# Patient Record
Sex: Male | Born: 1955 | Race: Asian | Hispanic: No | Marital: Married | Smoking: Never smoker
Health system: Southern US, Community
[De-identification: ages and names within clinical notes are randomized; demographics above are authoritative.]

## PROBLEM LIST (undated history)

## (undated) DIAGNOSIS — I10 Essential (primary) hypertension: Secondary | ICD-10-CM

## (undated) DIAGNOSIS — R7303 Prediabetes: Secondary | ICD-10-CM

## (undated) DIAGNOSIS — K219 Gastro-esophageal reflux disease without esophagitis: Secondary | ICD-10-CM

## (undated) DIAGNOSIS — E785 Hyperlipidemia, unspecified: Secondary | ICD-10-CM

## (undated) DIAGNOSIS — N400 Enlarged prostate without lower urinary tract symptoms: Secondary | ICD-10-CM

## (undated) HISTORY — PX: HIATAL HERNIA REPAIR: SHX195

---

## 2019-10-03 DIAGNOSIS — R0982 Postnasal drip: Secondary | ICD-10-CM | POA: Diagnosis not present

## 2019-10-03 DIAGNOSIS — J342 Deviated nasal septum: Secondary | ICD-10-CM | POA: Diagnosis not present

## 2019-10-03 DIAGNOSIS — E119 Type 2 diabetes mellitus without complications: Secondary | ICD-10-CM | POA: Diagnosis not present

## 2019-10-03 DIAGNOSIS — J301 Allergic rhinitis due to pollen: Secondary | ICD-10-CM | POA: Diagnosis not present

## 2019-10-11 DIAGNOSIS — J301 Allergic rhinitis due to pollen: Secondary | ICD-10-CM | POA: Diagnosis not present

## 2019-10-11 DIAGNOSIS — R059 Cough, unspecified: Secondary | ICD-10-CM | POA: Diagnosis not present

## 2019-10-11 DIAGNOSIS — R0982 Postnasal drip: Secondary | ICD-10-CM | POA: Diagnosis not present

## 2019-10-30 DIAGNOSIS — H04123 Dry eye syndrome of bilateral lacrimal glands: Secondary | ICD-10-CM | POA: Diagnosis not present

## 2019-10-30 DIAGNOSIS — H35713 Central serous chorioretinopathy, bilateral: Secondary | ICD-10-CM | POA: Diagnosis not present

## 2019-10-30 DIAGNOSIS — H43823 Vitreomacular adhesion, bilateral: Secondary | ICD-10-CM | POA: Diagnosis not present

## 2019-10-31 DIAGNOSIS — Z0000 Encounter for general adult medical examination without abnormal findings: Secondary | ICD-10-CM | POA: Diagnosis not present

## 2019-10-31 DIAGNOSIS — Z79899 Other long term (current) drug therapy: Secondary | ICD-10-CM | POA: Diagnosis not present

## 2019-10-31 DIAGNOSIS — E119 Type 2 diabetes mellitus without complications: Secondary | ICD-10-CM | POA: Diagnosis not present

## 2019-11-06 DIAGNOSIS — Z0000 Encounter for general adult medical examination without abnormal findings: Secondary | ICD-10-CM | POA: Diagnosis not present

## 2019-11-06 DIAGNOSIS — E119 Type 2 diabetes mellitus without complications: Secondary | ICD-10-CM | POA: Diagnosis not present

## 2019-11-06 DIAGNOSIS — N401 Enlarged prostate with lower urinary tract symptoms: Secondary | ICD-10-CM | POA: Diagnosis not present

## 2019-11-06 DIAGNOSIS — Z79899 Other long term (current) drug therapy: Secondary | ICD-10-CM | POA: Diagnosis not present

## 2019-11-06 DIAGNOSIS — R0982 Postnasal drip: Secondary | ICD-10-CM | POA: Diagnosis not present

## 2019-12-27 DIAGNOSIS — M5451 Vertebrogenic low back pain: Secondary | ICD-10-CM | POA: Diagnosis not present

## 2019-12-27 DIAGNOSIS — M9902 Segmental and somatic dysfunction of thoracic region: Secondary | ICD-10-CM | POA: Diagnosis not present

## 2019-12-27 DIAGNOSIS — M791 Myalgia, unspecified site: Secondary | ICD-10-CM | POA: Diagnosis not present

## 2019-12-27 DIAGNOSIS — M9903 Segmental and somatic dysfunction of lumbar region: Secondary | ICD-10-CM | POA: Diagnosis not present

## 2020-01-10 DIAGNOSIS — M5451 Vertebrogenic low back pain: Secondary | ICD-10-CM | POA: Diagnosis not present

## 2020-01-10 DIAGNOSIS — M9902 Segmental and somatic dysfunction of thoracic region: Secondary | ICD-10-CM | POA: Diagnosis not present

## 2020-01-10 DIAGNOSIS — M9903 Segmental and somatic dysfunction of lumbar region: Secondary | ICD-10-CM | POA: Diagnosis not present

## 2020-01-10 DIAGNOSIS — M791 Myalgia, unspecified site: Secondary | ICD-10-CM | POA: Diagnosis not present

## 2020-01-15 DIAGNOSIS — M791 Myalgia, unspecified site: Secondary | ICD-10-CM | POA: Diagnosis not present

## 2020-01-15 DIAGNOSIS — M9903 Segmental and somatic dysfunction of lumbar region: Secondary | ICD-10-CM | POA: Diagnosis not present

## 2020-01-15 DIAGNOSIS — M9902 Segmental and somatic dysfunction of thoracic region: Secondary | ICD-10-CM | POA: Diagnosis not present

## 2020-01-15 DIAGNOSIS — M5451 Vertebrogenic low back pain: Secondary | ICD-10-CM | POA: Diagnosis not present

## 2020-01-17 DIAGNOSIS — M791 Myalgia, unspecified site: Secondary | ICD-10-CM | POA: Diagnosis not present

## 2020-01-17 DIAGNOSIS — M9903 Segmental and somatic dysfunction of lumbar region: Secondary | ICD-10-CM | POA: Diagnosis not present

## 2020-01-17 DIAGNOSIS — M5451 Vertebrogenic low back pain: Secondary | ICD-10-CM | POA: Diagnosis not present

## 2020-01-17 DIAGNOSIS — M9902 Segmental and somatic dysfunction of thoracic region: Secondary | ICD-10-CM | POA: Diagnosis not present

## 2020-01-19 DIAGNOSIS — M5451 Vertebrogenic low back pain: Secondary | ICD-10-CM | POA: Diagnosis not present

## 2020-01-19 DIAGNOSIS — M791 Myalgia, unspecified site: Secondary | ICD-10-CM | POA: Diagnosis not present

## 2020-01-19 DIAGNOSIS — M9903 Segmental and somatic dysfunction of lumbar region: Secondary | ICD-10-CM | POA: Diagnosis not present

## 2020-01-19 DIAGNOSIS — M9902 Segmental and somatic dysfunction of thoracic region: Secondary | ICD-10-CM | POA: Diagnosis not present

## 2020-01-22 DIAGNOSIS — M5451 Vertebrogenic low back pain: Secondary | ICD-10-CM | POA: Diagnosis not present

## 2020-01-22 DIAGNOSIS — M9903 Segmental and somatic dysfunction of lumbar region: Secondary | ICD-10-CM | POA: Diagnosis not present

## 2020-01-22 DIAGNOSIS — M791 Myalgia, unspecified site: Secondary | ICD-10-CM | POA: Diagnosis not present

## 2020-01-22 DIAGNOSIS — M9902 Segmental and somatic dysfunction of thoracic region: Secondary | ICD-10-CM | POA: Diagnosis not present

## 2020-01-23 DIAGNOSIS — M791 Myalgia, unspecified site: Secondary | ICD-10-CM | POA: Diagnosis not present

## 2020-01-23 DIAGNOSIS — M5451 Vertebrogenic low back pain: Secondary | ICD-10-CM | POA: Diagnosis not present

## 2020-01-23 DIAGNOSIS — M9903 Segmental and somatic dysfunction of lumbar region: Secondary | ICD-10-CM | POA: Diagnosis not present

## 2020-01-23 DIAGNOSIS — M9902 Segmental and somatic dysfunction of thoracic region: Secondary | ICD-10-CM | POA: Diagnosis not present

## 2020-01-25 DIAGNOSIS — M9902 Segmental and somatic dysfunction of thoracic region: Secondary | ICD-10-CM | POA: Diagnosis not present

## 2020-01-25 DIAGNOSIS — M5451 Vertebrogenic low back pain: Secondary | ICD-10-CM | POA: Diagnosis not present

## 2020-01-25 DIAGNOSIS — M791 Myalgia, unspecified site: Secondary | ICD-10-CM | POA: Diagnosis not present

## 2020-01-25 DIAGNOSIS — M9903 Segmental and somatic dysfunction of lumbar region: Secondary | ICD-10-CM | POA: Diagnosis not present

## 2020-01-30 DIAGNOSIS — M9903 Segmental and somatic dysfunction of lumbar region: Secondary | ICD-10-CM | POA: Diagnosis not present

## 2020-01-30 DIAGNOSIS — Z79899 Other long term (current) drug therapy: Secondary | ICD-10-CM | POA: Diagnosis not present

## 2020-01-30 DIAGNOSIS — M5451 Vertebrogenic low back pain: Secondary | ICD-10-CM | POA: Diagnosis not present

## 2020-01-30 DIAGNOSIS — M9902 Segmental and somatic dysfunction of thoracic region: Secondary | ICD-10-CM | POA: Diagnosis not present

## 2020-01-30 DIAGNOSIS — M791 Myalgia, unspecified site: Secondary | ICD-10-CM | POA: Diagnosis not present

## 2020-01-30 DIAGNOSIS — E119 Type 2 diabetes mellitus without complications: Secondary | ICD-10-CM | POA: Diagnosis not present

## 2020-02-05 DIAGNOSIS — M791 Myalgia, unspecified site: Secondary | ICD-10-CM | POA: Diagnosis not present

## 2020-02-05 DIAGNOSIS — M9903 Segmental and somatic dysfunction of lumbar region: Secondary | ICD-10-CM | POA: Diagnosis not present

## 2020-02-05 DIAGNOSIS — M5451 Vertebrogenic low back pain: Secondary | ICD-10-CM | POA: Diagnosis not present

## 2020-02-05 DIAGNOSIS — M9902 Segmental and somatic dysfunction of thoracic region: Secondary | ICD-10-CM | POA: Diagnosis not present

## 2020-02-07 DIAGNOSIS — M791 Myalgia, unspecified site: Secondary | ICD-10-CM | POA: Diagnosis not present

## 2020-02-07 DIAGNOSIS — M5451 Vertebrogenic low back pain: Secondary | ICD-10-CM | POA: Diagnosis not present

## 2020-02-07 DIAGNOSIS — M9903 Segmental and somatic dysfunction of lumbar region: Secondary | ICD-10-CM | POA: Diagnosis not present

## 2020-02-07 DIAGNOSIS — M9902 Segmental and somatic dysfunction of thoracic region: Secondary | ICD-10-CM | POA: Diagnosis not present

## 2020-02-12 DIAGNOSIS — E119 Type 2 diabetes mellitus without complications: Secondary | ICD-10-CM | POA: Diagnosis not present

## 2020-02-12 DIAGNOSIS — G4733 Obstructive sleep apnea (adult) (pediatric): Secondary | ICD-10-CM | POA: Diagnosis not present

## 2020-02-12 DIAGNOSIS — N401 Enlarged prostate with lower urinary tract symptoms: Secondary | ICD-10-CM | POA: Diagnosis not present

## 2020-02-12 DIAGNOSIS — R519 Headache, unspecified: Secondary | ICD-10-CM | POA: Diagnosis not present

## 2020-02-28 DIAGNOSIS — G4733 Obstructive sleep apnea (adult) (pediatric): Secondary | ICD-10-CM | POA: Diagnosis not present

## 2020-02-28 DIAGNOSIS — R0683 Snoring: Secondary | ICD-10-CM | POA: Diagnosis not present

## 2020-02-29 DIAGNOSIS — R945 Abnormal results of liver function studies: Secondary | ICD-10-CM | POA: Diagnosis not present

## 2020-02-29 DIAGNOSIS — A084 Viral intestinal infection, unspecified: Secondary | ICD-10-CM | POA: Diagnosis not present

## 2020-02-29 DIAGNOSIS — K9289 Other specified diseases of the digestive system: Secondary | ICD-10-CM | POA: Diagnosis not present

## 2020-05-08 DIAGNOSIS — E119 Type 2 diabetes mellitus without complications: Secondary | ICD-10-CM | POA: Diagnosis not present

## 2020-05-08 DIAGNOSIS — Z6825 Body mass index (BMI) 25.0-25.9, adult: Secondary | ICD-10-CM | POA: Diagnosis not present

## 2020-05-08 DIAGNOSIS — N401 Enlarged prostate with lower urinary tract symptoms: Secondary | ICD-10-CM | POA: Diagnosis not present

## 2020-05-08 DIAGNOSIS — I10 Essential (primary) hypertension: Secondary | ICD-10-CM | POA: Diagnosis not present

## 2020-05-09 DIAGNOSIS — E119 Type 2 diabetes mellitus without complications: Secondary | ICD-10-CM | POA: Diagnosis not present

## 2020-05-09 DIAGNOSIS — K7581 Nonalcoholic steatohepatitis (NASH): Secondary | ICD-10-CM | POA: Diagnosis not present

## 2020-05-09 DIAGNOSIS — Z79899 Other long term (current) drug therapy: Secondary | ICD-10-CM | POA: Diagnosis not present

## 2020-05-09 DIAGNOSIS — E7800 Pure hypercholesterolemia, unspecified: Secondary | ICD-10-CM | POA: Diagnosis not present

## 2020-06-05 DIAGNOSIS — R7989 Other specified abnormal findings of blood chemistry: Secondary | ICD-10-CM | POA: Diagnosis not present

## 2020-06-05 DIAGNOSIS — K760 Fatty (change of) liver, not elsewhere classified: Secondary | ICD-10-CM | POA: Diagnosis not present

## 2020-06-05 DIAGNOSIS — K219 Gastro-esophageal reflux disease without esophagitis: Secondary | ICD-10-CM | POA: Diagnosis not present

## 2020-06-11 DIAGNOSIS — Z86010 Personal history of colonic polyps: Secondary | ICD-10-CM | POA: Diagnosis not present

## 2020-06-11 DIAGNOSIS — K635 Polyp of colon: Secondary | ICD-10-CM | POA: Diagnosis not present

## 2020-08-21 DIAGNOSIS — K7581 Nonalcoholic steatohepatitis (NASH): Secondary | ICD-10-CM | POA: Diagnosis not present

## 2020-08-21 DIAGNOSIS — E119 Type 2 diabetes mellitus without complications: Secondary | ICD-10-CM | POA: Diagnosis not present

## 2020-08-21 DIAGNOSIS — I10 Essential (primary) hypertension: Secondary | ICD-10-CM | POA: Diagnosis not present

## 2020-08-21 DIAGNOSIS — N401 Enlarged prostate with lower urinary tract symptoms: Secondary | ICD-10-CM | POA: Diagnosis not present

## 2020-08-27 DIAGNOSIS — Z79899 Other long term (current) drug therapy: Secondary | ICD-10-CM | POA: Diagnosis not present

## 2020-08-27 DIAGNOSIS — E119 Type 2 diabetes mellitus without complications: Secondary | ICD-10-CM | POA: Diagnosis not present

## 2020-09-23 DIAGNOSIS — H04123 Dry eye syndrome of bilateral lacrimal glands: Secondary | ICD-10-CM | POA: Diagnosis not present

## 2020-09-23 DIAGNOSIS — H43823 Vitreomacular adhesion, bilateral: Secondary | ICD-10-CM | POA: Diagnosis not present

## 2020-09-23 DIAGNOSIS — H26492 Other secondary cataract, left eye: Secondary | ICD-10-CM | POA: Diagnosis not present

## 2020-09-23 DIAGNOSIS — H35713 Central serous chorioretinopathy, bilateral: Secondary | ICD-10-CM | POA: Diagnosis not present

## 2020-11-25 DIAGNOSIS — N401 Enlarged prostate with lower urinary tract symptoms: Secondary | ICD-10-CM | POA: Diagnosis not present

## 2020-11-25 DIAGNOSIS — Z0000 Encounter for general adult medical examination without abnormal findings: Secondary | ICD-10-CM | POA: Diagnosis not present

## 2020-11-25 DIAGNOSIS — I10 Essential (primary) hypertension: Secondary | ICD-10-CM | POA: Diagnosis not present

## 2020-11-25 DIAGNOSIS — E7800 Pure hypercholesterolemia, unspecified: Secondary | ICD-10-CM | POA: Diagnosis not present

## 2020-11-25 DIAGNOSIS — E119 Type 2 diabetes mellitus without complications: Secondary | ICD-10-CM | POA: Diagnosis not present

## 2020-11-26 DIAGNOSIS — E7800 Pure hypercholesterolemia, unspecified: Secondary | ICD-10-CM | POA: Diagnosis not present

## 2020-11-26 DIAGNOSIS — N401 Enlarged prostate with lower urinary tract symptoms: Secondary | ICD-10-CM | POA: Diagnosis not present

## 2020-11-26 DIAGNOSIS — E119 Type 2 diabetes mellitus without complications: Secondary | ICD-10-CM | POA: Diagnosis not present

## 2020-11-26 DIAGNOSIS — Z0000 Encounter for general adult medical examination without abnormal findings: Secondary | ICD-10-CM | POA: Diagnosis not present

## 2020-12-11 DIAGNOSIS — R9431 Abnormal electrocardiogram [ECG] [EKG]: Secondary | ICD-10-CM | POA: Diagnosis not present

## 2020-12-11 DIAGNOSIS — M25512 Pain in left shoulder: Secondary | ICD-10-CM | POA: Diagnosis not present

## 2020-12-11 DIAGNOSIS — R0602 Shortness of breath: Secondary | ICD-10-CM | POA: Diagnosis not present

## 2020-12-11 DIAGNOSIS — E119 Type 2 diabetes mellitus without complications: Secondary | ICD-10-CM | POA: Diagnosis not present

## 2020-12-11 DIAGNOSIS — R072 Precordial pain: Secondary | ICD-10-CM | POA: Diagnosis not present

## 2020-12-11 DIAGNOSIS — M792 Neuralgia and neuritis, unspecified: Secondary | ICD-10-CM | POA: Diagnosis not present

## 2021-01-02 DIAGNOSIS — R072 Precordial pain: Secondary | ICD-10-CM | POA: Diagnosis not present

## 2021-01-02 DIAGNOSIS — I340 Nonrheumatic mitral (valve) insufficiency: Secondary | ICD-10-CM | POA: Diagnosis not present

## 2021-03-17 DIAGNOSIS — E119 Type 2 diabetes mellitus without complications: Secondary | ICD-10-CM | POA: Diagnosis not present

## 2021-03-17 DIAGNOSIS — I10 Essential (primary) hypertension: Secondary | ICD-10-CM | POA: Diagnosis not present

## 2021-03-17 DIAGNOSIS — Z79899 Other long term (current) drug therapy: Secondary | ICD-10-CM | POA: Diagnosis not present

## 2021-03-17 DIAGNOSIS — E7800 Pure hypercholesterolemia, unspecified: Secondary | ICD-10-CM | POA: Diagnosis not present

## 2021-03-17 DIAGNOSIS — L301 Dyshidrosis [pompholyx]: Secondary | ICD-10-CM | POA: Diagnosis not present

## 2021-03-17 DIAGNOSIS — N401 Enlarged prostate with lower urinary tract symptoms: Secondary | ICD-10-CM | POA: Diagnosis not present

## 2021-03-31 DIAGNOSIS — J301 Allergic rhinitis due to pollen: Secondary | ICD-10-CM | POA: Diagnosis not present

## 2021-03-31 DIAGNOSIS — R0982 Postnasal drip: Secondary | ICD-10-CM | POA: Diagnosis not present

## 2021-04-02 DIAGNOSIS — R0982 Postnasal drip: Secondary | ICD-10-CM | POA: Diagnosis not present

## 2021-04-02 DIAGNOSIS — J00 Acute nasopharyngitis [common cold]: Secondary | ICD-10-CM | POA: Diagnosis not present

## 2021-04-02 DIAGNOSIS — R051 Acute cough: Secondary | ICD-10-CM | POA: Diagnosis not present

## 2021-04-09 ENCOUNTER — Emergency Department: Payer: BC Managed Care – PPO

## 2021-04-09 ENCOUNTER — Encounter: Payer: Self-pay | Admitting: Emergency Medicine

## 2021-04-09 ENCOUNTER — Other Ambulatory Visit: Payer: Self-pay

## 2021-04-09 ENCOUNTER — Emergency Department
Admission: EM | Admit: 2021-04-09 | Discharge: 2021-04-09 | Disposition: A | Discharge status: Home / Self Care | Payer: BC Managed Care – PPO | Attending: Student in an Organized Health Care Education/Training Program | Admitting: Student in an Organized Health Care Education/Training Program

## 2021-04-09 DIAGNOSIS — R0602 Shortness of breath: Secondary | ICD-10-CM | POA: Diagnosis not present

## 2021-04-09 DIAGNOSIS — Z20822 Contact with and (suspected) exposure to covid-19: Secondary | ICD-10-CM | POA: Diagnosis not present

## 2021-04-09 DIAGNOSIS — J189 Pneumonia, unspecified organism: Secondary | ICD-10-CM | POA: Insufficient documentation

## 2021-04-09 DIAGNOSIS — R059 Cough, unspecified: Secondary | ICD-10-CM | POA: Diagnosis not present

## 2021-04-09 DIAGNOSIS — K449 Diaphragmatic hernia without obstruction or gangrene: Secondary | ICD-10-CM | POA: Diagnosis not present

## 2021-04-09 HISTORY — DX: Essential (primary) hypertension: I10

## 2021-04-09 HISTORY — DX: Prediabetes: R73.03

## 2021-04-09 HISTORY — DX: Hyperlipidemia, unspecified: E78.5

## 2021-04-09 HISTORY — DX: Benign prostatic hyperplasia without lower urinary tract symptoms: N40.0

## 2021-04-09 HISTORY — DX: Gastro-esophageal reflux disease without esophagitis: K21.9

## 2021-04-09 LAB — CBC
HCT: 42.3 % (ref 39.0–52.0)
Hemoglobin: 14.5 g/dL (ref 13.0–17.0)
MCH: 28.9 pg (ref 26.0–34.0)
MCHC: 34.3 g/dL (ref 30.0–36.0)
MCV: 84.3 fL (ref 80.0–100.0)
Platelets: 377 10*3/uL (ref 150–400)
RBC: 5.02 MIL/uL (ref 4.22–5.81)
RDW: 12.2 % (ref 11.5–15.5)
WBC: 13.7 10*3/uL — ABNORMAL HIGH (ref 4.0–10.5)
nRBC: 0 % (ref 0.0–0.2)

## 2021-04-09 LAB — BASIC METABOLIC PANEL
Anion gap: 10 (ref 5–15)
BUN: 11 mg/dL (ref 8–23)
CO2: 21 mmol/L — ABNORMAL LOW (ref 22–32)
Calcium: 9.3 mg/dL (ref 8.9–10.3)
Chloride: 102 mmol/L (ref 98–111)
Creatinine, Ser: 0.89 mg/dL (ref 0.61–1.24)
GFR, Estimated: >60 mL/min (ref >60)
Glucose, Bld: 117 mg/dL — ABNORMAL HIGH (ref 70–99)
Potassium: 4 mmol/L (ref 3.5–5.1)
Sodium: 133 mmol/L — ABNORMAL LOW (ref 135–145)

## 2021-04-09 LAB — RESP PANEL BY RT-PCR (FLU A&B, COVID) ARPGX2
Influenza A by PCR: NEGATIVE
Influenza B by PCR: NEGATIVE
SARS Coronavirus 2 by RT PCR: NEGATIVE

## 2021-04-09 LAB — TROPONIN I (HIGH SENSITIVITY): Troponin I (High Sensitivity): 3 ng/L (ref <18)

## 2021-04-09 MED ORDER — ALBUTEROL SULFATE HFA 108 (90 BASE) MCG/ACT IN AERS: 2.0000 | INHALATION_SPRAY | Freq: Four times a day (QID) | RESPIRATORY_TRACT | 2 refills | Status: DC | PRN

## 2021-04-09 MED ORDER — CEFDINIR 300 MG PO CAPS: 300.0000 mg | ORAL_CAPSULE | Freq: Two times a day (BID) | ORAL | 0 refills | Status: DC

## 2021-04-09 MED ORDER — IOHEXOL 350 MG/ML SOLN
75.0000 mL | Freq: Once | INTRAVENOUS | Status: AC | PRN
  Administered 2021-04-09: 75 mL via INTRAVENOUS
  Filled 2021-04-09: qty 75

## 2021-04-09 MED ORDER — IPRATROPIUM-ALBUTEROL 0.5-2.5 (3) MG/3ML IN SOLN
3.0000 mL | Freq: Once | RESPIRATORY_TRACT | Status: AC
  Administered 2021-04-09: 3 mL via RESPIRATORY_TRACT
  Filled 2021-04-09: qty 3

## 2021-04-09 MED ORDER — PREDNISONE 20 MG PO TABS: 40.0000 mg | ORAL_TABLET | Freq: Every day | ORAL | 0 refills | Status: AC

## 2021-04-09 MED ORDER — PREDNISONE 20 MG PO TABS
40.0000 mg | ORAL_TABLET | Freq: Once | ORAL | Status: AC
  Administered 2021-04-09: 40 mg via ORAL
  Filled 2021-04-09: qty 2

## 2021-04-09 NOTE — ED Triage Notes (Signed)
Pt comes into the ED via POV c/o cough and SHOB that has been worsening post abx and steroid shot.  Pt states this has been ongoing for 2 weeks.  Pt denies any xrays having been done so far.  PT also presents "hoarse" in his throat.  Pt currently has even and unlabored respirations.  ?

## 2021-04-09 NOTE — ED Notes (Signed)
Ambulatory pulse ox obtained. Pt ambulated down hallway and steady gait noted. Pt reported no dyspnea or SOB. Pulse ox went from 94 to 90%.  ?

## 2021-04-09 NOTE — ED Provider Notes (Signed)
? ?[facility] ?Provider Note ? ? ? Event Date/Time  ? First MD Initiated Contact with Patient 04/09/21 1253   ?  (approximate) ? ? ?History  ? ?Cough and Shortness of Breath ? ? ?HPI ? ?Noah Ho is a 66 y.o. male recently diagnosed with commune acquired pneumonia started on doxycycline presents to the ER for persistent cough feel like his symptoms or not improving after 5 days of doxycycline.  Denies any history of bronchitis or COPD.  No nausea or vomiting.  Has had some sore throat secondary to the frequent coughing spells.  Does not wear oxygen does not smoke.  Does have some discomfort with deep inspiration.  No lower extremity swelling. ?  ? ? ?Physical Exam  ? ?Triage Vital Signs: ?ED Triage Vitals  ?Enc Vitals Group  ?   BP 04/09/21 1255 (!) 159/97  ?   Pulse Rate 04/09/21 1255 97  ?   Resp 04/09/21 1255 18  ?   Temp 04/09/21 1255 98.7 ?F (37.1 ?C)  ?   Temp Source 04/09/21 1255 Oral  ?   SpO2 04/09/21 1255 97 %  ?   Weight 04/09/21 1247 158 lb (71.7 kg)  ?   Height 04/09/21 1247 5\' 7"  (1.702 m)  ?   Head Circumference --   ?   Peak Flow --   ?   Pain Score 04/09/21 1246 4  ?   Pain Loc --   ?   Pain Edu? --   ?   Excl. in GC? --   ? ? ?Most recent vital signs: ?Vitals:  ? 04/09/21 1330 04/09/21 1400  ?BP: (!) 131/91 130/83  ?Pulse: 87 96  ?Resp:  18  ?Temp:    ?SpO2: 93% 95%  ? ? ? ?Constitutional: Alert  ?Eyes: Conjunctivae are normal.  ?Head: Atraumatic. ?Nose: No congestion/rhinnorhea. ?Mouth/Throat: Mucous membranes are moist.   ?Neck: Painless ROM.  ?Cardiovascular:   Good peripheral circulation. ?Respiratory: Normal respiratory effort.  No retractions.  Scattered occasional rhonchi and wheeze. ?Gastrointestinal: Soft and nontender.  ?Musculoskeletal:  no deformity ?Neurologic:  MAE spontaneously. No gross focal neurologic deficits are appreciated.  ?Skin:  Skin is warm, dry and intact. No rash noted. ?Psychiatric: Mood and affect are normal. Speech and behavior are  normal. ? ? ? ?ED Results / Procedures / Treatments  ? ?Labs ?(all labs ordered are listed, but only abnormal results are displayed) ?Labs Reviewed  ?BASIC METABOLIC PANEL - Abnormal; Notable for the following components:  ?    Result Value  ? Sodium 133 (*)   ? CO2 21 (*)   ? Glucose, Bld 117 (*)   ? All other components within normal limits  ?CBC - Abnormal; Notable for the following components:  ? WBC 13.7 (*)   ? All other components within normal limits  ?RESP PANEL BY RT-PCR (FLU A&B, COVID) [id number]  ?TROPONIN I (HIGH SENSITIVITY)  ? ? ? ?EKG ? ?ED ECG REPORT ?I, 06/09/21, the attending physician, personally viewed and interpreted this ECG. ? ? Date: 04/09/2021 ? EKG Time: 12:52 ? Rate: 100 ? Rhythm: sinus ? Axis: normal ? Intervals:iRBBB ? ST&T Change: no stemi, no depressions ? ? ? ?RADIOLOGY ?Please see ED Course for my review and interpretation. ? ?I personally reviewed all radiographic images ordered to evaluate for the above acute complaints and reviewed radiology reports and findings.  These findings were personally discussed with the patient.  Please see medical record for radiology report. ? ? ? ?  PROCEDURES: ? ?Critical Care performed:  ? ?Procedures ? ? ?MEDICATIONS ORDERED IN ED: ?Medications  ?ipratropium-albuterol (DUONEB) 0.5-2.5 (3) MG/3ML nebulizer solution 3 mL (3 mLs Nebulization Given 04/09/21 1337)  ?iohexol (OMNIPAQUE) 350 MG/ML injection 75 mL (75 mLs Intravenous Contrast Given 04/09/21 1429)  ?predniSONE (DELTASONE) tablet 40 mg (40 mg Oral Given 04/09/21 1518)  ? ? ? ?IMPRESSION / MDM / ASSESSMENT AND PLAN / ED COURSE  ?I reviewed the triage vital signs and the nursing notes. ?             ?               ? ?Differential diagnosis includes, but is not limited to, pneumonia, viral illness, CHF, bronchitis, PE, anemia, ? ?Patient presenting with symptoms as described above.  Currently afebrile hemodynamically stable no hypoxia no significant tachypnea.  Exam as above.  Blood work sent  with above differential.  Will give nebulizer.  Chest x-ray by my review and interpretation does not show pneumothorax or effusion. ? ? ?Clinical Course as of 04/09/21 1521  ?Wed Apr 09, 2021  ?1345 Mild leukocytosis possible patchy opacities concerning for possible pneumonia though no fever.  Given his pleuritic pain will order CT imaging to rule out PE malignancy.  We will give nebulizer there does not have any history of bronchitis or COPD to see if he has any clinical improvement. [PR]  ?1437 CTA chest by my review and interpretation does not show any evidence of PE. [PR]  ?1511 Patient was able to ambulate without any hypoxia.  States that he otherwise feels okay with ambulation and is just frustrated with persistent cough not improving after doxycycline.  His port score is 64.  Otherwise well-appearing not septic not hypoxic for we discussed option for hospitalization versus continued outpatient management.  Patient feels comfortable with outpatient management.  Will add on cefdinir as well as short course of prednisone as well as albuterol.  Discussed return precautions.  Patient agreeable to plan. [PR]  ?  ?Clinical Course User Index ?[PR] Willy Eddy, MD  ? ? ? ?FINAL CLINICAL IMPRESSION(S) / ED DIAGNOSES  ? ?Final diagnoses:  ?Community acquired pneumonia of right lung, unspecified part of lung  ? ? ? ?Rx / DC Orders  ? ?ED Discharge Orders   ? ?      Ordered  ?  predniSONE (DELTASONE) 20 MG tablet  Daily       ? 04/09/21 1517  ?  cefdinir (OMNICEF) 300 MG capsule  2 times daily       ? 04/09/21 1517  ?  albuterol (VENTOLIN HFA) 108 (90 Base) MCG/ACT inhaler  Every 6 hours PRN       ? 04/09/21 1517  ? ?  ?  ? ?  ? ? ? ?Note:  This document was prepared using Dragon voice recognition software and may include unintentional dictation errors. ? ?  ?Willy Eddy, MD ?04/09/21 1521 ? ?

## 2021-04-17 ENCOUNTER — Ambulatory Visit (INDEPENDENT_AMBULATORY_CARE_PROVIDER_SITE_OTHER): Payer: BC Managed Care – PPO | Admitting: Nurse Practitioner

## 2021-04-17 ENCOUNTER — Encounter: Payer: Self-pay | Admitting: Nurse Practitioner

## 2021-04-17 VITALS — BP 135/77 | HR 78 | Ht 67.0 in | Wt 155.6 lb

## 2021-04-17 DIAGNOSIS — E119 Type 2 diabetes mellitus without complications: Secondary | ICD-10-CM | POA: Diagnosis not present

## 2021-04-17 DIAGNOSIS — Z7689 Persons encountering health services in other specified circumstances: Secondary | ICD-10-CM

## 2021-04-17 DIAGNOSIS — E785 Hyperlipidemia, unspecified: Secondary | ICD-10-CM | POA: Diagnosis not present

## 2021-04-17 DIAGNOSIS — I10 Essential (primary) hypertension: Secondary | ICD-10-CM

## 2021-04-17 DIAGNOSIS — R052 Subacute cough: Secondary | ICD-10-CM | POA: Diagnosis not present

## 2021-04-17 DIAGNOSIS — K219 Gastro-esophageal reflux disease without esophagitis: Secondary | ICD-10-CM

## 2021-04-17 MED ORDER — BENZONATATE 100 MG PO CAPS: 100.0000 mg | ORAL_CAPSULE | Freq: Two times a day (BID) | ORAL | 0 refills | Status: DC | PRN

## 2021-04-17 NOTE — Progress Notes (Signed)
? ?New Patient Office Visit ? ?Subjective:  ?Patient ID: Noah Ho, male    DOB: 06-28-1955  Age: 66 y.o. MRN: [id number] ? ?CC:  ?Chief Complaint  ?Patient presents with  ? New Patient (Initial Visit)  ? ? ?HPI ?Noah Ho presents for establing care. Patient retired and moved here from [place]. His previous PCP was Dr. Dennison Nancy. Patient has h/o of hypertension, hyperlipidemia, diabetes, BPH and GERD. ? ?Patient was diagnosed with CAP on 04/09/2021. He is doing well. Denies  fever or SOB at present. He still has cough.  ? ?Past Medical History:  ?Diagnosis Date  ? Borderline diabetes   ? BPH (benign prostatic hyperplasia)   ? GERD (gastroesophageal reflux disease)   ? Hyperlipidemia   ? Hypertension   ? ? ?Past Surgical History:  ?Procedure Laterality Date  ? HIATAL HERNIA REPAIR    ? ? ?History reviewed. No pertinent family history. ? ?Social History  ? ?Socioeconomic History  ? Marital status: Married  ?  Spouse name: Not on file  ? Number of children: Not on file  ? Years of education: Not on file  ? Highest education level: Not on file  ?Occupational History  ? Not on file  ?Tobacco Use  ? Smoking status: Never  ? Smokeless tobacco: Never  ?Substance and Sexual Activity  ? Alcohol use: Yes  ?  Comment: occasional  ? Drug use: Not on file  ? Sexual activity: Not on file  ?Other Topics Concern  ? Not on file  ?Social History Narrative  ? Not on file  ? ?Social Determinants of Health  ? ?Financial Resource Strain: Not on file  ?Food Insecurity: Not on file  ?Transportation Needs: Not on file  ?Physical Activity: Not on file  ?Stress: Not on file  ?Social Connections: Not on file  ?Intimate Partner Violence: Not on file  ? ? ?ROS ?Review of Systems  ?Constitutional:  Negative for activity change, appetite change and fatigue.  ?HENT:  Negative for congestion, drooling, postnasal drip and sore throat.   ?Eyes:  Negative for discharge and redness.  ?Respiratory:  Positive for cough.  Negative for chest tightness, shortness of breath and wheezing.   ?Cardiovascular:  Negative for chest pain and palpitations.  ?Gastrointestinal:  Negative for abdominal pain and diarrhea.  ?Genitourinary:  Negative for difficulty urinating and hematuria.  ?Musculoskeletal:  Negative for arthralgias and joint swelling.  ?Skin:  Negative for color change and rash.  ?Neurological:  Negative for dizziness, facial asymmetry and headaches.  ?Psychiatric/Behavioral:  Negative for agitation, behavioral problems and confusion.   ? ?Objective:  ? ?Today's Vitals: BP 135/77   Pulse 78   Ht 5\' 7"  (1.702 m)   Wt 155 lb 9.6 oz (70.6 kg)   BMI 24.37 kg/m?  ? ?Physical Exam ?Constitutional:   ?   Appearance: Normal appearance. He is normal weight.  ?HENT:  ?   Head: Normocephalic and atraumatic.  ?   Right Ear: Tympanic membrane normal.  ?   Left Ear: Tympanic membrane normal.  ?   Nose: Nose normal.  ?   Mouth/Throat:  ?   Mouth: Mucous membranes are moist.  ?Eyes:  ?   Extraocular Movements: Extraocular movements intact.  ?   Conjunctiva/sclera: Conjunctivae normal.  ?   Pupils: Pupils are equal, round, and reactive to light.  ?Cardiovascular:  ?   Rate and Rhythm: Normal rate and regular rhythm.  ?   Pulses: Normal pulses.  ?  Heart sounds: Normal heart sounds.  ?Pulmonary:  ?   Effort: Pulmonary effort is normal.  ?   Breath sounds: Normal breath sounds. No wheezing.  ?Abdominal:  ?   General: Bowel sounds are normal.  ?   Palpations: Abdomen is soft.  ?Musculoskeletal:     ?   General: Normal range of motion.  ?   Cervical back: Normal range of motion.  ?Skin: ?   General: Skin is warm.  ?   Capillary Refill: Capillary refill takes less than 2 seconds.  ?Neurological:  ?   General: No focal deficit present.  ?   Mental Status: He is alert and oriented to person, place, and time. Mental status is at baseline.  ?Psychiatric:     ?   Mood and Affect: Mood normal.     ?   Behavior: Behavior normal.     ?   Thought Content:  Thought content normal.     ?   Judgment: Judgment normal.  ? ? ?Assessment & Plan:  ? ?Problem List Items Addressed This Visit   ? ?  ? Cardiovascular and Mediastinum  ? Primary hypertension  ?  Stable on medication. ? ?  ?  ? Relevant Medications  ? diltiazem (TIAZAC) 240 MG 24 hr capsule  ? rosuvastatin (CRESTOR) 10 MG tablet  ?  ? Endocrine  ? Type 2 diabetes mellitus without complication, without long-term current use of insulin ([place])  ?  Continue metformin 500 mg daily. ?Will continue to monitor. ? ? ?  ?  ? Relevant Medications  ? metFORMIN (GLUCOPHAGE-XR) 500 MG 24 hr tablet  ? rosuvastatin (CRESTOR) 10 MG tablet  ?  ? Other  ? Subacute cough  ?  Started on benzonatate 100 mg as needed for cough. ? ?  ?  ? Establishing care with new doctor, encounter for - Primary  ?  Care established. ?Labs reviewed from last month. ? ?  ?  ? Hyperlipidemia  ? Relevant Medications  ? diltiazem (TIAZAC) 240 MG 24 hr capsule  ? rosuvastatin (CRESTOR) 10 MG tablet  ? ? ?Outpatient Encounter Medications as of 04/17/2021  ?Medication Sig  ? benzonatate (TESSALON) 100 MG capsule Take 1 capsule (100 mg total) by mouth 2 (two) times daily as needed for cough.  ? diltiazem (TIAZAC) 240 MG 24 hr capsule Take 1 capsule by mouth daily.  ? metFORMIN (GLUCOPHAGE-XR) 500 MG 24 hr tablet Take 500 mg by mouth daily.  ? rosuvastatin (CRESTOR) 10 MG tablet Take 10 mg by mouth at bedtime.  ? tamsulosin (FLOMAX) 0.4 MG CAPS capsule Take 0.4 mg by mouth.  ? [DISCONTINUED] albuterol (VENTOLIN HFA) 108 (90 Base) MCG/ACT inhaler Inhale 2 puffs into the lungs every 6 (six) hours as needed for wheezing or shortness of breath.  ? [DISCONTINUED] cefdinir (OMNICEF) 300 MG capsule Take 1 capsule (300 mg total) by mouth 2 (two) times daily for 10 days.  ? ?No facility-administered encounter medications on file as of 04/17/2021.  ? ? ?Follow-up: No follow-ups on file.  ? ?Theresia Lo, NP ? ?

## 2021-04-20 DIAGNOSIS — E119 Type 2 diabetes mellitus without complications: Secondary | ICD-10-CM | POA: Insufficient documentation

## 2021-04-20 DIAGNOSIS — I10 Essential (primary) hypertension: Secondary | ICD-10-CM | POA: Insufficient documentation

## 2021-04-20 DIAGNOSIS — R052 Subacute cough: Secondary | ICD-10-CM | POA: Insufficient documentation

## 2021-04-20 DIAGNOSIS — E785 Hyperlipidemia, unspecified: Secondary | ICD-10-CM | POA: Insufficient documentation

## 2021-04-20 DIAGNOSIS — Z7689 Persons encountering health services in other specified circumstances: Secondary | ICD-10-CM | POA: Insufficient documentation

## 2021-04-20 DIAGNOSIS — K219 Gastro-esophageal reflux disease without esophagitis: Secondary | ICD-10-CM | POA: Insufficient documentation

## 2021-04-20 NOTE — Assessment & Plan Note (Signed)
Started on benzonatate 100 mg as needed for cough. ?

## 2021-04-20 NOTE — Assessment & Plan Note (Addendum)
Care established. ?Labs reviewed from last month. ?

## 2021-04-23 NOTE — Assessment & Plan Note (Signed)
Continue metformin 500 mg daily. ?Will continue to monitor. ? ?

## 2021-04-23 NOTE — Assessment & Plan Note (Signed)
Stable on medication

## 2021-05-07 DIAGNOSIS — H16142 Punctate keratitis, left eye: Secondary | ICD-10-CM | POA: Diagnosis not present

## 2021-05-15 ENCOUNTER — Ambulatory Visit: Payer: BC Managed Care – PPO | Admitting: Nurse Practitioner

## 2021-05-15 ENCOUNTER — Encounter: Payer: Self-pay | Admitting: Nurse Practitioner

## 2021-05-15 VITALS — BP 125/80 | HR 79 | Ht 67.0 in | Wt 158.1 lb

## 2021-05-15 DIAGNOSIS — S93402A Sprain of unspecified ligament of left ankle, initial encounter: Secondary | ICD-10-CM | POA: Diagnosis not present

## 2021-05-15 MED ORDER — ROSUVASTATIN CALCIUM 10 MG PO TABS: 10.0000 mg | ORAL_TABLET | Freq: Every day | ORAL | 3 refills | Status: DC

## 2021-05-15 MED ORDER — IBUPROFEN 800 MG PO TABS
800.0000 mg | ORAL_TABLET | Freq: Three times a day (TID) | ORAL | 0 refills | Status: DC | PRN
Start: 2021-05-15 — End: 2021-08-14

## 2021-05-15 MED ORDER — DICLOFENAC SODIUM 1 % EX GEL: 4.0000 g | Freq: Two times a day (BID) | CUTANEOUS | 1 refills | Status: DC

## 2021-05-15 MED ORDER — DILTIAZEM HCL ER COATED BEADS 240 MG PO CP24: 240.0000 mg | ORAL_CAPSULE | Freq: Every day | ORAL | 3 refills | Status: AC

## 2021-05-15 MED ORDER — TAMSULOSIN HCL 0.4 MG PO CAPS: 0.4000 mg | ORAL_CAPSULE | Freq: Every day | ORAL | 3 refills | Status: DC

## 2021-05-15 NOTE — Assessment & Plan Note (Addendum)
Started patient on Ibuprofen 800 mg every 8 hours as needed for pain. ?Apply diclofenac gel twice a day on the ankle. ?Advised him to RICE rest, ice, compression and elevate the foot for 2-3 days.  ? ?

## 2021-05-15 NOTE — Progress Notes (Signed)
? ?Established Patient Office Visit ? ?Subjective:  ?Patient ID: Noah CaneGirishkumar B Amyx, male    DOB: 02/03/1955  Age: 66 y.o. MRN: [id number] ? ?CC:  ?Chief Complaint  ?Patient presents with  ? Ankle Pain  ?  Patient having left sided ankle pain and swelling x 5 days.  ? ? ? ?HPI ? ?Noah CaneGirishkumar B Ramsay presents for ankle swelling and pain from last 5 days. He does not remember to twist or sprain his ankle recently but he had a 6 hours driving 6 days ago.  ?Ankle Pain  ?The incident occurred 2 days ago. There was no injury mechanism. The pain is present in the left ankle. The quality of the pain is described as aching. The pain is at a severity of 3/10. The pain is mild. The pain has been Intermittent since onset. Pertinent negatives include no inability to bear weight or loss of motion. He reports no foreign bodies present. The symptoms are aggravated by movement and weight bearing. He has tried rest and elevation for the symptoms. The treatment provided no relief.   ? ?Past Medical History:  ?Diagnosis Date  ? Borderline diabetes   ? BPH (benign prostatic hyperplasia)   ? GERD (gastroesophageal reflux disease)   ? Hyperlipidemia   ? Hypertension   ? ? ?Past Surgical History:  ?Procedure Laterality Date  ? HIATAL HERNIA REPAIR    ? ? ?No family history on file. ? ?Social History  ? ?Socioeconomic History  ? Marital status: Married  ?  Spouse name: Not on file  ? Number of children: Not on file  ? Years of education: Not on file  ? Highest education level: Not on file  ?Occupational History  ? Not on file  ?Tobacco Use  ? Smoking status: Never  ? Smokeless tobacco: Never  ?Substance and Sexual Activity  ? Alcohol use: Yes  ?  Comment: occasional  ? Drug use: Not on file  ? Sexual activity: Not on file  ?Other Topics Concern  ? Not on file  ?Social History Narrative  ? Not on file  ? ?Social Determinants of Health  ? ?Financial Resource Strain: Not on file  ?Food Insecurity: Not on file  ?Transportation Needs: Not on file   ?Physical Activity: Not on file  ?Stress: Not on file  ?Social Connections: Not on file  ?Intimate Partner Violence: Not on file  ? ? ? ?Outpatient Medications Prior to Visit  ?Medication Sig Dispense Refill  ? benzonatate (TESSALON) 100 MG capsule Take 1 capsule (100 mg total) by mouth 2 (two) times daily as needed for cough. 20 capsule 0  ? diltiazem (TIAZAC) 240 MG 24 hr capsule Take 1 capsule by mouth daily.    ? metFORMIN (GLUCOPHAGE-XR) 500 MG 24 hr tablet Take 500 mg by mouth daily.    ? rosuvastatin (CRESTOR) 10 MG tablet Take 10 mg by mouth at bedtime.    ? tamsulosin (FLOMAX) 0.4 MG CAPS capsule Take 0.4 mg by mouth.    ? diltiazem (CARDIZEM CD) 240 MG 24 hr capsule Take 240 mg by mouth daily at 2 PM.    ? ?No facility-administered medications prior to visit.  ? ? ?No Known Allergies ? ?ROS ?Review of Systems  ?Constitutional:  Negative for activity change and fatigue.  ?HENT:  Negative for congestion, hearing loss and sore throat.   ?Eyes:  Negative for discharge and redness.  ?Respiratory:  Negative for chest tightness and shortness of breath.   ?Cardiovascular:  Negative for chest  pain and palpitations.  ?Gastrointestinal:  Negative for abdominal distention, blood in stool and constipation.  ?Genitourinary:  Negative for difficulty urinating and frequency.  ?Musculoskeletal:  Positive for joint swelling.  ?Skin:  Negative for color change, pallor and rash.  ?Neurological:  Negative for dizziness, facial asymmetry and headaches.  ?Psychiatric/Behavioral:  Negative for agitation, behavioral problems, confusion and decreased concentration.   ? ?  ?Objective:  ?  ?Physical Exam ?Constitutional:   ?   Appearance: Normal appearance. He is normal weight.  ?HENT:  ?   Head: Normocephalic and atraumatic.  ?   Right Ear: Tympanic membrane normal.  ?   Left Ear: Tympanic membrane normal.  ?   Nose: Nose normal.  ?   Mouth/Throat:  ?   Mouth: Mucous membranes are moist.  ?   Pharynx: Oropharynx is clear.  ?Eyes:   ?   Extraocular Movements: Extraocular movements intact.  ?   Conjunctiva/sclera: Conjunctivae normal.  ?   Pupils: Pupils are equal, round, and reactive to light.  ?Cardiovascular:  ?   Rate and Rhythm: Normal rate and regular rhythm.  ?   Pulses: Normal pulses.  ?   Heart sounds: Normal heart sounds.  ?Pulmonary:  ?   Effort: Pulmonary effort is normal.  ?   Breath sounds: Normal breath sounds.  ?Abdominal:  ?   General: Abdomen is flat. Bowel sounds are normal.  ?   Palpations: Abdomen is soft.  ?Musculoskeletal:  ?   Cervical back: Normal range of motion.  ?   Left ankle: Swelling present. Tenderness present.  ?Skin: ?   General: Skin is warm.  ?   Capillary Refill: Capillary refill takes less than 2 seconds.  ?Neurological:  ?   General: No focal deficit present.  ?   Mental Status: He is alert and oriented to person, place, and time. Mental status is at baseline.  ?Psychiatric:     ?   Mood and Affect: Mood normal.     ?   Behavior: Behavior normal.     ?   Thought Content: Thought content normal.     ?   Judgment: Judgment normal.  ? ? ?BP 125/80   Pulse 79   Ht 5\' 7"  (1.702 m)   Wt 158 lb 1.6 oz (71.7 kg)   BMI 24.76 kg/m?  ?Wt Readings from Last 3 Encounters:  ?05/15/21 158 lb 1.6 oz (71.7 kg)  ?04/17/21 155 lb 9.6 oz (70.6 kg)  ?04/09/21 158 lb (71.7 kg)  ? ? ? ?Health Maintenance Due  ?Topic Date Due  ? HEMOGLOBIN A1C  Never done  ? COVID-19 Vaccine (1) Never done  ? FOOT EXAM  Never done  ? OPHTHALMOLOGY EXAM  Never done  ? URINE MICROALBUMIN  Never done  ? HIV Screening  Never done  ? Hepatitis C Screening  Never done  ? TETANUS/TDAP  Never done  ? COLONOSCOPY (Pts 45-71yrs Insurance coverage will need to be confirmed)  Never done  ? Zoster Vaccines- Shingrix (1 of 2) Never done  ? ? ?There are no preventive care reminders to display for this patient. ? ?No results found for: TSH ?Lab Results  ?Component Value Date  ? WBC 13.7 (H) 04/09/2021  ? HGB 14.5 04/09/2021  ? HCT 42.3 04/09/2021  ? MCV 84.3  04/09/2021  ? PLT 377 04/09/2021  ? ?Lab Results  ?Component Value Date  ? NA 133 (L) 04/09/2021  ? K 4.0 04/09/2021  ? CO2 21 (L) 04/09/2021  ?  GLUCOSE 117 (H) 04/09/2021  ? BUN 11 04/09/2021  ? CREATININE 0.89 04/09/2021  ? CALCIUM 9.3 04/09/2021  ? ANIONGAP 10 04/09/2021  ? ?No results found for: CHOL ?No results found for: HDL ?No results found for: LDLCALC ?No results found for: TRIG ?No results found for: CHOLHDL ?No results found for: HGBA1C ? ?  ?Assessment & Plan:  ? ?Problem List Items Addressed This Visit   ? ?  ? Musculoskeletal and Integument  ? Sprain of left ankle - Primary  ?  Started patient on Ibuprofen 800 mg every 8 hours as needed for pain. ?Apply diclofenac gel twice a day on the ankle. ?Advised him to RICE rest, ice, compression and elevate the foot for 2-3 days.  ? ? ?  ?  ? ? ? ?Meds ordered this encounter  ?Medications  ? tamsulosin (FLOMAX) 0.4 MG CAPS capsule  ?  Sig: Take 1 capsule (0.4 mg total) by mouth daily.  ?  Dispense:  90 capsule  ?  Refill:  3  ? rosuvastatin (CRESTOR) 10 MG tablet  ?  Sig: Take 1 tablet (10 mg total) by mouth at bedtime.  ?  Dispense:  90 tablet  ?  Refill:  3  ? diltiazem (CARDIZEM CD) 240 MG 24 hr capsule  ?  Sig: Take 1 capsule (240 mg total) by mouth daily at 2 PM.  ?  Dispense:  90 capsule  ?  Refill:  3  ? ibuprofen (ADVIL) 800 MG tablet  ?  Sig: Take 1 tablet (800 mg total) by mouth every 8 (eight) hours as needed.  ?  Dispense:  30 tablet  ?  Refill:  0  ? diclofenac Sodium (VOLTAREN) 1 % GEL  ?  Sig: Apply 4 g topically 2 (two) times daily.  ?  Dispense:  100 g  ?  Refill:  1  ? ? ? ?Follow-up: Return if symptoms worsen or fail to improve.  ? ? ?Kara Dies, NP ?

## 2021-05-30 DIAGNOSIS — S93491A Sprain of other ligament of right ankle, initial encounter: Secondary | ICD-10-CM | POA: Diagnosis not present

## 2021-05-30 DIAGNOSIS — M7671 Peroneal tendinitis, right leg: Secondary | ICD-10-CM | POA: Diagnosis not present

## 2021-06-27 DIAGNOSIS — M7671 Peroneal tendinitis, right leg: Secondary | ICD-10-CM | POA: Diagnosis not present

## 2021-06-27 DIAGNOSIS — S93491A Sprain of other ligament of right ankle, initial encounter: Secondary | ICD-10-CM | POA: Diagnosis not present

## 2021-07-07 DIAGNOSIS — M25571 Pain in right ankle and joints of right foot: Secondary | ICD-10-CM | POA: Diagnosis not present

## 2021-07-09 ENCOUNTER — Other Ambulatory Visit: Payer: Self-pay | Admitting: *Deleted

## 2021-07-09 MED ORDER — METFORMIN HCL ER 500 MG PO TB24: 500.0000 mg | ORAL_TABLET | Freq: Every day | ORAL | 3 refills | Status: AC

## 2021-07-09 MED ORDER — TAMSULOSIN HCL 0.4 MG PO CAPS: 0.4000 mg | ORAL_CAPSULE | Freq: Every day | ORAL | 3 refills | Status: AC

## 2021-07-14 ENCOUNTER — Ambulatory Visit
Admission: RE | Admit: 2021-07-14 | Discharge: 2021-07-14 | Disposition: A | Discharge status: Home / Self Care | Payer: BC Managed Care – PPO | Source: Ambulatory Visit | Attending: Internal Medicine | Admitting: Internal Medicine

## 2021-07-14 ENCOUNTER — Other Ambulatory Visit
Admission: RE | Admit: 2021-07-14 | Discharge: 2021-07-14 | Disposition: A | Discharge status: Home / Self Care | Payer: BC Managed Care – PPO | Source: Home / Self Care | Attending: Internal Medicine | Admitting: Internal Medicine

## 2021-07-14 ENCOUNTER — Ambulatory Visit: Payer: BC Managed Care – PPO | Admitting: Internal Medicine

## 2021-07-14 ENCOUNTER — Encounter: Payer: Self-pay | Admitting: Internal Medicine

## 2021-07-14 VITALS — BP 153/93 | HR 116 | Temp 101.8°F | Ht 67.0 in | Wt 157.6 lb

## 2021-07-14 DIAGNOSIS — Z20822 Contact with and (suspected) exposure to covid-19: Secondary | ICD-10-CM

## 2021-07-14 DIAGNOSIS — R052 Subacute cough: Secondary | ICD-10-CM | POA: Diagnosis not present

## 2021-07-14 DIAGNOSIS — R0989 Other specified symptoms and signs involving the circulatory and respiratory systems: Secondary | ICD-10-CM | POA: Diagnosis not present

## 2021-07-14 DIAGNOSIS — K219 Gastro-esophageal reflux disease without esophagitis: Secondary | ICD-10-CM

## 2021-07-14 DIAGNOSIS — I10 Essential (primary) hypertension: Secondary | ICD-10-CM | POA: Diagnosis not present

## 2021-07-14 DIAGNOSIS — E119 Type 2 diabetes mellitus without complications: Secondary | ICD-10-CM

## 2021-07-14 LAB — CBC WITH DIFFERENTIAL/PLATELET
Abs Immature Granulocytes: 0.09 10*3/uL — ABNORMAL HIGH (ref 0.00–0.07)
Basophils Absolute: 0.1 10*3/uL (ref 0.0–0.1)
Basophils Relative: 1 %
Eosinophils Absolute: 0.2 10*3/uL (ref 0.0–0.5)
Eosinophils Relative: 1 %
HCT: 42.5 % (ref 39.0–52.0)
Hemoglobin: 14.8 g/dL (ref 13.0–17.0)
Immature Granulocytes: 1 %
Lymphocytes Relative: 9 %
Lymphs Abs: 1.6 10*3/uL (ref 0.7–4.0)
MCH: 29.8 pg (ref 26.0–34.0)
MCHC: 34.8 g/dL (ref 30.0–36.0)
MCV: 85.5 fL (ref 80.0–100.0)
Monocytes Absolute: 1.1 10*3/uL — ABNORMAL HIGH (ref 0.1–1.0)
Monocytes Relative: 6 %
Neutro Abs: 14.5 10*3/uL — ABNORMAL HIGH (ref 1.7–7.7)
Neutrophils Relative %: 82 %
Platelets: 215 10*3/uL (ref 150–400)
RBC: 4.97 MIL/uL (ref 4.22–5.81)
RDW: 11.9 % (ref 11.5–15.5)
WBC: 17.6 10*3/uL — ABNORMAL HIGH (ref 4.0–10.5)
nRBC: 0 % (ref 0.0–0.2)

## 2021-07-14 LAB — POC COVID19 BINAXNOW: SARS Coronavirus 2 Ag: NEGATIVE

## 2021-07-14 MED ORDER — LEVOFLOXACIN 500 MG PO TABS: 500.0000 mg | ORAL_TABLET | Freq: Every day | ORAL | 0 refills | Status: AC

## 2021-07-14 NOTE — Assessment & Plan Note (Signed)

## 2021-07-14 NOTE — Progress Notes (Signed)
New Patient Office Visit  Subjective:  Patient ID: Noah Ho, male    DOB: May 14, 1955  Age: 66 y.o. MRN: [id number]  CC:  Chief Complaint  Patient presents with   Sore Throat    Patient reports sore throat, headache, and weakness    Sore Throat    Patient presents for fever chills chest tightness temperature up to 102, there is no history of COVID patient has been treated for pneumonia recently.  I suggested the patient that he should go to the hospital but he does not want to go to the hospital, he claims that he has an ice cold water in [place] which caused the problem.  He does not have any trouble swallowing.  Throat is minimally congested on examination.  He is planning to go to [place] in 1 or 2 days.  I told him with this kind of temperature I cannot let him go to pick up the flight, he will be started on Levaquin we will do a chest x-ray and CBC he will come and see me tomorrow and then we will reevaluate the situation.  Past Medical History:  Diagnosis Date   Borderline diabetes    BPH (benign prostatic hyperplasia)    GERD (gastroesophageal reflux disease)    Hyperlipidemia    Hypertension      Current Outpatient Medications:    benzonatate (TESSALON) 100 MG capsule, Take 1 capsule (100 mg total) by mouth 2 (two) times daily as needed for cough., Disp: 20 capsule, Rfl: 0   diclofenac Sodium (VOLTAREN) 1 % GEL, Apply 4 g topically 2 (two) times daily., Disp: 100 g, Rfl: 1   diltiazem (CARDIZEM CD) 240 MG 24 hr capsule, Take 1 capsule (240 mg total) by mouth daily at 2 PM., Disp: 90 capsule, Rfl: 3   diltiazem (TIAZAC) 240 MG 24 hr capsule, Take 1 capsule by mouth daily., Disp: , Rfl:    ibuprofen (ADVIL) 800 MG tablet, Take 1 tablet (800 mg total) by mouth every 8 (eight) hours as needed., Disp: 30 tablet, Rfl: 0   levofloxacin (LEVAQUIN) 500 MG tablet, Take 1 tablet (500 mg total) by mouth daily for 7 days., Disp: 7 tablet, Rfl: 0   metFORMIN (GLUCOPHAGE-XR) 500  MG 24 hr tablet, Take 1 tablet (500 mg total) by mouth daily., Disp: 90 tablet, Rfl: 3   rosuvastatin (CRESTOR) 10 MG tablet, Take 1 tablet (10 mg total) by mouth at bedtime., Disp: 90 tablet, Rfl: 3   tamsulosin (FLOMAX) 0.4 MG CAPS capsule, Take 1 capsule (0.4 mg total) by mouth daily., Disp: 90 capsule, Rfl: 3   Past Surgical History:  Procedure Laterality Date   HIATAL HERNIA REPAIR      No family history on file.  Social History   Socioeconomic History   Marital status: Married    Spouse name: Not on file   Number of children: Not on file   Years of education: Not on file   Highest education level: Not on file  Occupational History   Not on file  Tobacco Use   Smoking status: Never   Smokeless tobacco: Never  Substance and Sexual Activity   Alcohol use: Yes    Comment: occasional   Drug use: Not on file   Sexual activity: Not on file  Other Topics Concern   Not on file  Social History Narrative   Not on file   Social Determinants of Health   Financial Resource Strain: Not on file  Food Insecurity:  Not on file  Transportation Needs: Not on file  Physical Activity: Not on file  Stress: Not on file  Social Connections: Not on file  Intimate Partner Violence: Not on file    ROS Review of Systems  Constitutional: Negative.   HENT: Negative.    Eyes: Negative.   Respiratory: Negative.    Cardiovascular: Negative.   Gastrointestinal: Negative.   Endocrine: Negative.   Genitourinary: Negative.   Musculoskeletal: Negative.   Skin: Negative.   Allergic/Immunologic: Negative.   Neurological: Negative.   Hematological: Negative.   Psychiatric/Behavioral: Negative.    All other systems reviewed and are negative.   Objective:   Today's Vitals: BP (!) 153/93   Pulse (!) 116   Temp (!) 101.8 F (38.8 C)   Ht 5\' 7"  (1.702 m)   Wt 157 lb 9.6 oz (71.5 kg)   BMI 24.68 kg/m   Physical Exam Constitutional:      Appearance: He is normal weight.  HENT:      Mouth/Throat:     Mouth: Mucous membranes are moist.     Pharynx: Uvula midline.  Pulmonary:     Breath sounds: Rhonchi present. No rales.  Neurological:     General: No focal deficit present.     Assessment & Plan:   Problem List Items Addressed This Visit       Cardiovascular and Mediastinum   Primary hypertension     Patient denies any chest pain or shortness of breath there is no history of palpitation or paroxysmal nocturnal dyspnea   patient was advised to follow low-salt low-cholesterol diet    ideally I want to keep systolic blood pressure below 130 mmHg, patient was asked to check blood pressure one times a week and give me a report on that.  Patient will be follow-up in 3 months  or earlier as needed, patient will call me back for any change in the cardiovascular symptoms Patient was advised to buy a book from local bookstore concerning blood pressure and read several chapters  every day.  This will be supplemented by some of the material we will give him from the office.  Patient should also utilize other resources like YouTube and Internet to learn more about the blood pressure and the diet.        Respiratory   Chest congestion   Relevant Orders   DG Chest 2 View     Digestive   GERD (gastroesophageal reflux disease)    Patient educated extensively on acid reflux lifestyle modification, including buying a bed wedge, not eating 3 hrs before bedtime, diet modifications, and handout given for the same.         Endocrine   Type 2 diabetes mellitus without complication, without long-term current use of insulin ([place])    - The patient's blood sugar is labile on med. - The patient will continue the current treatment regimen.  - I encouraged the patient to regularly check blood sugar.  - I encouraged the patient to monitor diet. I encouraged the patient to eat low-carb and low-sugar to help prevent blood sugar spikes.  - I encouraged the patient to continue following  their prescribed treatment plan for diabetes - I informed the patient to get help if blood sugar drops below 54mg /dL, or if suddenly have trouble thinking clearly or breathing.  Patient was advised to buy a book on diabetes from a local bookstore or from [place]  Patient should read 2 chapters every day to keep the motivation  going, this is in addition to some of the materials we provided them from the office.  There are other resources on the Internet like YouTube and wilkipedia to get an education on the diabetes        Other   Subacute cough    Patient came in with upper respiratory symptoms with fever rhonchi in the chest with a clinical history that he has a history of pneumonia in the past.  I asked the patient to get to the hospital to get a CBC done and a chest x-ray and in the meantime I will put him on Levaquin 500 mg p.o. daily for 10 days.  He is planning to go to [place] and I told him that if his temperature continues to climb then he should not take a flight.  If the temperature shoots up tonight he should go to the emergency room.      Relevant Orders   CBC with Differential/Platelet   Other Visit Diagnoses     Suspected COVID-19 virus infection    -  Primary   Relevant Orders   POC COVID-19 (Completed)       Outpatient Encounter Medications as of 07/14/2021  Medication Sig   benzonatate (TESSALON) 100 MG capsule Take 1 capsule (100 mg total) by mouth 2 (two) times daily as needed for cough.   diclofenac Sodium (VOLTAREN) 1 % GEL Apply 4 g topically 2 (two) times daily.   diltiazem (CARDIZEM CD) 240 MG 24 hr capsule Take 1 capsule (240 mg total) by mouth daily at 2 PM.   diltiazem (TIAZAC) 240 MG 24 hr capsule Take 1 capsule by mouth daily.   ibuprofen (ADVIL) 800 MG tablet Take 1 tablet (800 mg total) by mouth every 8 (eight) hours as needed.   levofloxacin (LEVAQUIN) 500 MG tablet Take 1 tablet (500 mg total) by mouth daily for 7 days.   metFORMIN (GLUCOPHAGE-XR) 500 MG  24 hr tablet Take 1 tablet (500 mg total) by mouth daily.   rosuvastatin (CRESTOR) 10 MG tablet Take 1 tablet (10 mg total) by mouth at bedtime.   tamsulosin (FLOMAX) 0.4 MG CAPS capsule Take 1 capsule (0.4 mg total) by mouth daily.   No facility-administered encounter medications on file as of 07/14/2021.    Follow-up: No follow-ups on file.   Corky Downs, MD

## 2021-07-14 NOTE — Assessment & Plan Note (Signed)
Patient educated extensively on acid reflux lifestyle modification, including buying a bed wedge, not eating 3 hrs before bedtime, diet modifications, and handout given for the same.  

## 2021-07-14 NOTE — Assessment & Plan Note (Signed)
Patient came in with upper respiratory symptoms with fever rhonchi in the chest with a clinical history that he has a history of pneumonia in the past.  I asked the patient to get to the hospital to get a CBC done and a chest x-ray and in the meantime I will put him on Levaquin 500 mg p.o. daily for 10 days.  He is planning to go to [place] and I told him that if his temperature continues to climb then he should not take a flight.  If the temperature shoots up tonight he should go to the emergency room.

## 2021-07-14 NOTE — Assessment & Plan Note (Signed)

## 2021-07-15 ENCOUNTER — Ambulatory Visit (INDEPENDENT_AMBULATORY_CARE_PROVIDER_SITE_OTHER): Payer: BC Managed Care – PPO | Admitting: Internal Medicine

## 2021-07-15 ENCOUNTER — Encounter: Payer: Self-pay | Admitting: Internal Medicine

## 2021-07-15 VITALS — BP 135/81 | HR 93 | Temp 98.9°F | Ht 67.0 in | Wt 157.3 lb

## 2021-07-15 DIAGNOSIS — I10 Essential (primary) hypertension: Secondary | ICD-10-CM | POA: Diagnosis not present

## 2021-07-15 DIAGNOSIS — E785 Hyperlipidemia, unspecified: Secondary | ICD-10-CM | POA: Diagnosis not present

## 2021-07-15 DIAGNOSIS — E119 Type 2 diabetes mellitus without complications: Secondary | ICD-10-CM | POA: Diagnosis not present

## 2021-07-15 DIAGNOSIS — K219 Gastro-esophageal reflux disease without esophagitis: Secondary | ICD-10-CM

## 2021-07-15 NOTE — Assessment & Plan Note (Signed)

## 2021-07-15 NOTE — Assessment & Plan Note (Signed)

## 2021-07-15 NOTE — Assessment & Plan Note (Signed)

## 2021-07-15 NOTE — Progress Notes (Signed)
Established Patient Office Visit  Subjective:  Patient ID: Noah Ho, male    DOB: Aug 17, 1955  Age: 66 y.o. MRN: [id number]  CC:  Chief Complaint  Patient presents with   Follow-up    Patient here for 1 day follow up, patient states that he is feeling better and has not ran a fever since yesterday. Patient had chest xray yesterday.     HPI  Noah Ho presents forfollow up, wbc count  is  more than 17000/ chest xray is ok  Past Medical History:  Diagnosis Date   Borderline diabetes    BPH (benign prostatic hyperplasia)    GERD (gastroesophageal reflux disease)    Hyperlipidemia    Hypertension     Past Surgical History:  Procedure Laterality Date   HIATAL HERNIA REPAIR      No family history on file.  Social History   Socioeconomic History   Marital status: Married    Spouse name: Not on file   Number of children: Not on file   Years of education: Not on file   Highest education level: Not on file  Occupational History   Not on file  Tobacco Use   Smoking status: Never   Smokeless tobacco: Never  Substance and Sexual Activity   Alcohol use: Yes    Comment: occasional   Drug use: Not on file   Sexual activity: Not on file  Other Topics Concern   Not on file  Social History Narrative   Not on file   Social Determinants of Health   Financial Resource Strain: Not on file  Food Insecurity: Not on file  Transportation Needs: Not on file  Physical Activity: Not on file  Stress: Not on file  Social Connections: Not on file  Intimate Partner Violence: Not on file     Current Outpatient Medications:    benzonatate (TESSALON) 100 MG capsule, Take 1 capsule (100 mg total) by mouth 2 (two) times daily as needed for cough., Disp: 20 capsule, Rfl: 0   diclofenac Sodium (VOLTAREN) 1 % GEL, Apply 4 g topically 2 (two) times daily., Disp: 100 g, Rfl: 1   diltiazem (CARDIZEM CD) 240 MG 24 hr capsule, Take 1 capsule (240 mg total) by mouth daily  at 2 PM., Disp: 90 capsule, Rfl: 3   diltiazem (TIAZAC) 240 MG 24 hr capsule, Take 1 capsule by mouth daily., Disp: , Rfl:    ibuprofen (ADVIL) 800 MG tablet, Take 1 tablet (800 mg total) by mouth every 8 (eight) hours as needed., Disp: 30 tablet, Rfl: 0   levofloxacin (LEVAQUIN) 500 MG tablet, Take 1 tablet (500 mg total) by mouth daily for 7 days., Disp: 7 tablet, Rfl: 0   metFORMIN (GLUCOPHAGE-XR) 500 MG 24 hr tablet, Take 1 tablet (500 mg total) by mouth daily., Disp: 90 tablet, Rfl: 3   rosuvastatin (CRESTOR) 10 MG tablet, Take 1 tablet (10 mg total) by mouth at bedtime., Disp: 90 tablet, Rfl: 3   tamsulosin (FLOMAX) 0.4 MG CAPS capsule, Take 1 capsule (0.4 mg total) by mouth daily., Disp: 90 capsule, Rfl: 3   No Known Allergies  ROS Review of Systems  Constitutional: Negative.  Negative for chills.  HENT:  Positive for sore throat. Negative for nosebleeds.   Eyes: Negative.  Negative for redness.  Respiratory: Negative.    Cardiovascular: Negative.   Gastrointestinal: Negative.   Endocrine: Negative.   Genitourinary: Negative.   Musculoskeletal: Negative.   Skin: Negative.   Allergic/Immunologic: Negative.  Neurological: Negative.  Negative for speech difficulty.  Hematological: Negative.   Psychiatric/Behavioral: Negative.    All other systems reviewed and are negative.     Objective:    Physical Exam Vitals reviewed.  Constitutional:      Appearance: Normal appearance.  HENT:     Mouth/Throat:     Mouth: Mucous membranes are moist.  Eyes:     Pupils: Pupils are equal, round, and reactive to light.  Neck:     Vascular: No carotid bruit.  Cardiovascular:     Rate and Rhythm: Normal rate and regular rhythm.     Pulses: Normal pulses.     Heart sounds: Normal heart sounds.  Pulmonary:     Effort: Pulmonary effort is normal.     Breath sounds: Normal breath sounds.  Abdominal:     General: Bowel sounds are normal.     Palpations: Abdomen is soft. There is no  hepatomegaly, splenomegaly or mass.     Tenderness: There is no abdominal tenderness.     Hernia: No hernia is present.  Musculoskeletal:     Cervical back: Neck supple.     Right lower leg: No edema.     Left lower leg: No edema.  Skin:    Findings: No rash.  Neurological:     Mental Status: He is alert and oriented to person, place, and time.     Motor: No weakness.  Psychiatric:        Mood and Affect: Mood normal.        Behavior: Behavior normal.     BP 135/81   Pulse 93   Temp 98.9 F (37.2 C)   Ht 5\' 7"  (1.702 m)   Wt 157 lb 4.8 oz (71.4 kg)   BMI 24.64 kg/m  Wt Readings from Last 3 Encounters:  07/15/21 157 lb 4.8 oz (71.4 kg)  07/14/21 157 lb 9.6 oz (71.5 kg)  05/15/21 158 lb 1.6 oz (71.7 kg)     Health Maintenance Due  Topic Date Due   HEMOGLOBIN A1C  Never done   COVID-19 Vaccine (1) Never done   FOOT EXAM  Never done   OPHTHALMOLOGY EXAM  Never done   URINE MICROALBUMIN  Never done   HIV Screening  Never done   Hepatitis C Screening  Never done   TETANUS/TDAP  Never done   COLONOSCOPY (Pts 45-30yrs Insurance coverage will need to be confirmed)  Never done   Zoster Vaccines- Shingrix (1 of 2) Never done    There are no preventive care reminders to display for this patient.  No results found for: "TSH" Lab Results  Component Value Date   WBC 17.6 (H) 07/14/2021   HGB 14.8 07/14/2021   HCT 42.5 07/14/2021   MCV 85.5 07/14/2021   PLT 215 07/14/2021   Lab Results  Component Value Date   NA 133 (L) 04/09/2021   K 4.0 04/09/2021   CO2 21 (L) 04/09/2021   GLUCOSE 117 (H) 04/09/2021   BUN 11 04/09/2021   CREATININE 0.89 04/09/2021   CALCIUM 9.3 04/09/2021   ANIONGAP 10 04/09/2021   No results found for: "CHOL" No results found for: "HDL" No results found for: "LDLCALC" No results found for: "TRIG" No results found for: "CHOLHDL" No results found for: "HGBA1C"    Assessment & Plan:   Problem List Items Addressed This Visit        Cardiovascular and Mediastinum   Primary hypertension - Primary     Patient denies  any chest pain or shortness of breath there is no history of palpitation or paroxysmal nocturnal dyspnea   patient was advised to follow low-salt low-cholesterol diet    ideally I want to keep systolic blood pressure below 130 mmHg, patient was asked to check blood pressure one times a week and give me a report on that.  Patient will be follow-up in 3 months  or earlier as needed, patient will call me back for any change in the cardiovascular symptoms Patient was advised to buy a book from local bookstore concerning blood pressure and read several chapters  every day.  This will be supplemented by some of the material we will give him from the office.  Patient should also utilize other resources like YouTube and Internet to learn more about the blood pressure and the diet.        Digestive   GERD (gastroesophageal reflux disease)    Counseling  If a person has gastroesophageal reflux disease (GERD), food and stomach acid move back up into the esophagus and cause symptoms or problems such as damage to the esophagus.  Anti-reflux measures include: raising the head of the bed, avoiding tight clothing or belts, avoiding eating late at night, not lying down shortly after mealtime, and achieving weight loss.  Avoid ASA, NSAID's, caffeine, alcohol, and tobacco.   OTC Pepcid and/or Tums are often very helpful for as needed use.   However, for persisting chronic or daily symptoms, stronger medications like Omeprazole may be needed.  You may need to avoid foods and drinks such as: ? Coffee and tea (with or without caffeine). ? Drinks that contain alcohol. ? Energy drinks and sports drinks. ? Bubbly (carbonated) drinks or sodas. ? Chocolate and cocoa. ? Peppermint and mint flavorings. ? Garlic and onions. ? Horseradish. ? Spicy and acidic foods. These include peppers, chili powder, curry powder, vinegar, hot  sauces, and BBQ sauce. ? Citrus fruit juices and citrus fruits, such as oranges, lemons, and limes. ? Tomato-based foods. These include red sauce, chili, salsa, and pizza with red sauce. ? Fried and fatty foods. These include donuts, french fries, potato chips, and high-fat dressings. ? High-fat meats. These include hot dogs, rib eye steak, sausage, ham, and bacon.         Endocrine   Type 2 diabetes mellitus without complication, without long-term current use of insulin ([place])    - The patient's blood sugar is labile on med. - The patient will continue the current treatment regimen.  - I encouraged the patient to regularly check blood sugar.  - I encouraged the patient to monitor diet. I encouraged the patient to eat low-carb and low-sugar to help prevent blood sugar spikes.  - I encouraged the patient to continue following their prescribed treatment plan for diabetes - I informed the patient to get help if blood sugar drops below 54mg /dL, or if suddenly have trouble thinking clearly or breathing.  Patient was advised to buy a book on diabetes from a local bookstore or from [place]  Patient should read 2 chapters every day to keep the motivation going, this is in addition to some of the materials we provided them from the office.  There are other resources on the Internet like YouTube and wilkipedia to get an education on the diabetes        Other   Hyperlipidemia    Hypercholesterolemia  I advised the patient to follow Mediterranean diet This diet is rich in fruits vegetables and whole grain,  and This diet is also rich in fish and lean meat Patient should also eat a handful of almonds or walnuts daily Recent heart study indicated that average follow-up on this kind of diet reduces the cardiovascular mortality by 50 to 70%==      Patient has an elevated WBC count of 17,000 we will repeat it in about 3 to 4 weeks he does not have any lymph nodes in the neck axilla groin there is no  evidence of hepatosplenomegaly.  We will follow-up on the WBC count if his WBC count is still elevated we might think a referral to hematologist. No orders of the defined types were placed in this encounter.   Follow-up: No follow-ups on file.    Corky Downs, MD

## 2021-07-15 NOTE — Assessment & Plan Note (Signed)
Hypercholesterolemia  I advised the patient to follow Mediterranean diet This diet is rich in fruits vegetables and whole grain, and This diet is also rich in fish and lean meat Patient should also eat a handful of almonds or walnuts daily Recent heart study indicated that average follow-up on this kind of diet reduces the cardiovascular mortality by 50 to 70%== 

## 2021-07-17 ENCOUNTER — Ambulatory Visit: Payer: BC Managed Care – PPO | Admitting: Nurse Practitioner

## 2021-07-29 DIAGNOSIS — M7671 Peroneal tendinitis, right leg: Secondary | ICD-10-CM | POA: Diagnosis not present

## 2021-07-30 DIAGNOSIS — E1100 Type 2 diabetes mellitus with hyperosmolarity without nonketotic hyperglycemic-hyperosmolar coma (NKHHC): Secondary | ICD-10-CM | POA: Diagnosis not present

## 2021-07-30 DIAGNOSIS — I38 Endocarditis, valve unspecified: Secondary | ICD-10-CM | POA: Diagnosis not present

## 2021-07-30 DIAGNOSIS — I10 Essential (primary) hypertension: Secondary | ICD-10-CM | POA: Diagnosis not present

## 2021-07-30 DIAGNOSIS — M25572 Pain in left ankle and joints of left foot: Secondary | ICD-10-CM | POA: Diagnosis not present

## 2021-07-30 DIAGNOSIS — R9431 Abnormal electrocardiogram [ECG] [EKG]: Secondary | ICD-10-CM | POA: Diagnosis not present

## 2021-07-31 ENCOUNTER — Ambulatory Visit: Payer: BC Managed Care – PPO | Admitting: Nurse Practitioner

## 2021-08-12 ENCOUNTER — Ambulatory Visit (INDEPENDENT_AMBULATORY_CARE_PROVIDER_SITE_OTHER): Payer: BC Managed Care – PPO | Admitting: *Deleted

## 2021-08-12 DIAGNOSIS — M25572 Pain in left ankle and joints of left foot: Secondary | ICD-10-CM | POA: Diagnosis not present

## 2021-08-12 DIAGNOSIS — Z0000 Encounter for general adult medical examination without abnormal findings: Secondary | ICD-10-CM

## 2021-08-12 DIAGNOSIS — E785 Hyperlipidemia, unspecified: Secondary | ICD-10-CM | POA: Diagnosis not present

## 2021-08-12 DIAGNOSIS — I10 Essential (primary) hypertension: Secondary | ICD-10-CM | POA: Diagnosis not present

## 2021-08-12 DIAGNOSIS — E119 Type 2 diabetes mellitus without complications: Secondary | ICD-10-CM

## 2021-08-12 DIAGNOSIS — Z7689 Persons encountering health services in other specified circumstances: Secondary | ICD-10-CM

## 2021-08-13 LAB — COMPLETE METABOLIC PANEL WITH GFR
AG Ratio: 1.9 (calc) (ref 1.0–2.5)
ALT: 53 U/L — ABNORMAL HIGH (ref 9–46)
AST: 39 U/L — ABNORMAL HIGH (ref 10–35)
Albumin: 4.5 g/dL (ref 3.6–5.1)
Alkaline phosphatase (APISO): 64 U/L (ref 35–144)
BUN: 13 mg/dL (ref 7–25)
CO2: 22 mmol/L (ref 20–32)
Calcium: 9.5 mg/dL (ref 8.6–10.3)
Chloride: 102 mmol/L (ref 98–110)
Creat: 0.84 mg/dL (ref 0.70–1.35)
Globulin: 2.4 g/dL (calc) (ref 1.9–3.7)
Glucose, Bld: 110 mg/dL — ABNORMAL HIGH (ref 65–99)
Potassium: 5 mmol/L (ref 3.5–5.3)
Sodium: 138 mmol/L (ref 135–146)
Total Bilirubin: 0.4 mg/dL (ref 0.2–1.2)
Total Protein: 6.9 g/dL (ref 6.1–8.1)
eGFR: 97 mL/min/{1.73_m2} (ref >=60)

## 2021-08-13 LAB — LIPID PANEL
Cholesterol: 124 mg/dL (ref <200)
HDL: 63 mg/dL (ref >=40)
LDL Cholesterol (Calc): 37 mg/dL (calc)
Non-HDL Cholesterol (Calc): 61 mg/dL (calc) (ref <130)
Total CHOL/HDL Ratio: 2 (calc) (ref <5.0)
Triglycerides: 153 mg/dL — ABNORMAL HIGH (ref <150)

## 2021-08-13 LAB — CBC WITH DIFFERENTIAL/PLATELET
Absolute Monocytes: 420 cells/uL (ref 200–950)
Basophils Absolute: 67 cells/uL (ref 0–200)
Basophils Relative: 1.2 %
Eosinophils Absolute: 459 cells/uL (ref 15–500)
Eosinophils Relative: 8.2 %
HCT: 45 % (ref 38.5–50.0)
Hemoglobin: 15.2 g/dL (ref 13.2–17.1)
Lymphs Abs: 2201 cells/uL (ref 850–3900)
MCH: 30 pg (ref 27.0–33.0)
MCHC: 33.8 g/dL (ref 32.0–36.0)
MCV: 88.9 fL (ref 80.0–100.0)
MPV: 9.8 fL (ref 7.5–12.5)
Monocytes Relative: 7.5 %
Neutro Abs: 2453 cells/uL (ref 1500–7800)
Neutrophils Relative %: 43.8 %
Platelets: 271 10*3/uL (ref 140–400)
RBC: 5.06 10*6/uL (ref 4.20–5.80)
RDW: 12.3 % (ref 11.0–15.0)
Total Lymphocyte: 39.3 %
WBC: 5.6 10*3/uL (ref 3.8–10.8)

## 2021-08-13 LAB — PSA: PSA: 3.74 ng/mL (ref <=4.00)

## 2021-08-13 LAB — HEMOGLOBIN A1C
Hgb A1c MFr Bld: 6.6 % of total Hgb — ABNORMAL HIGH (ref <5.7)
Mean Plasma Glucose: 143 mg/dL
eAG (mmol/L): 7.9 mmol/L

## 2021-08-13 LAB — TSH: TSH: 1.05 mIU/L (ref 0.40–4.50)

## 2021-08-14 ENCOUNTER — Ambulatory Visit: Payer: BC Managed Care – PPO | Admitting: Nurse Practitioner

## 2021-08-14 ENCOUNTER — Encounter: Payer: Self-pay | Admitting: Nurse Practitioner

## 2021-08-14 VITALS — BP 130/88 | HR 96 | Ht 67.0 in | Wt 158.1 lb

## 2021-08-14 DIAGNOSIS — I10 Essential (primary) hypertension: Secondary | ICD-10-CM

## 2021-08-14 DIAGNOSIS — R748 Abnormal levels of other serum enzymes: Secondary | ICD-10-CM

## 2021-08-14 DIAGNOSIS — E119 Type 2 diabetes mellitus without complications: Secondary | ICD-10-CM | POA: Diagnosis not present

## 2021-08-14 LAB — GLUCOSE, POCT (MANUAL RESULT ENTRY): POC Glucose: 179 mg/dL — AB (ref 70–99)

## 2021-08-14 NOTE — Assessment & Plan Note (Signed)
Patient has elevated AST and ALT. Advised patient to cut down on alcohol. Will repeat the labs in a month.

## 2021-08-14 NOTE — Progress Notes (Signed)
Established Patient Office Visit  Subjective:  Patient ID: Noah Ho, male    DOB: [birth year]  Age: 66 y.o. MRN: [id number]  CC:  Chief Complaint  Patient presents with   Lab Results     HPI  Noah Ho presents for routine follow up. Patient has history of hypertension, diabetes, BPH, GERD and hyperlipidemia.  He had MRI on his ankle done at [facility] and is going for a therapy for his ankle. He is taking meloxicam 7.5 mg once a day.    HPI   Past Medical History:  Diagnosis Date   Borderline diabetes    BPH (benign prostatic hyperplasia)    GERD (gastroesophageal reflux disease)    Hyperlipidemia    Hypertension     Past Surgical History:  Procedure Laterality Date   HIATAL HERNIA REPAIR      No family history on file.  Social History   Socioeconomic History   Marital status: Married    Spouse name: Not on file   Number of children: Not on file   Years of education: Not on file   Highest education level: Not on file  Occupational History   Not on file  Tobacco Use   Smoking status: Never   Smokeless tobacco: Never  Substance and Sexual Activity   Alcohol use: Yes    Alcohol/week: 3.0 standard drinks of alcohol    Types: 3 Standard drinks or equivalent per week   Drug use: Not on file   Sexual activity: Not on file  Other Topics Concern   Not on file  Social History Narrative   Not on file   Social Determinants of Health   Financial Resource Strain: Not on file  Food Insecurity: Not on file  Transportation Needs: Not on file  Physical Activity: Not on file  Stress: Not on file  Social Connections: Not on file  Intimate Partner Violence: Not on file     Outpatient Medications Prior to Visit  Medication Sig Dispense Refill   diclofenac Sodium (VOLTAREN) 1 % GEL Apply 4 g topically 2 (two) times daily. 100 g 1   diltiazem (CARDIZEM CD) 240 MG 24 hr capsule Take 1 capsule (240 mg total) by mouth daily at 2 PM. 90 capsule  3   meloxicam (MOBIC) 7.5 MG tablet Take 7.5 mg by mouth 2 (two) times daily.     metFORMIN (GLUCOPHAGE-XR) 500 MG 24 hr tablet Take 1 tablet (500 mg total) by mouth daily. 90 tablet 3   rosuvastatin (CRESTOR) 10 MG tablet Take 1 tablet (10 mg total) by mouth at bedtime. 90 tablet 3   tamsulosin (FLOMAX) 0.4 MG CAPS capsule Take 1 capsule (0.4 mg total) by mouth daily. 90 capsule 3   benzonatate (TESSALON) 100 MG capsule Take 1 capsule (100 mg total) by mouth 2 (two) times daily as needed for cough. (Patient not taking: Reported on 08/14/2021) 20 capsule 0   diltiazem (TIAZAC) 240 MG 24 hr capsule Take 1 capsule by mouth daily. (Patient not taking: Reported on 08/14/2021)     ibuprofen (ADVIL) 800 MG tablet Take 1 tablet (800 mg total) by mouth every 8 (eight) hours as needed. (Patient not taking: Reported on 08/14/2021) 30 tablet 0   No facility-administered medications prior to visit.    No Known Allergies  ROS Review of Systems  Constitutional:  Negative for activity change and fatigue.  HENT:  Negative for congestion, hearing loss and sore throat.   Eyes:  Negative for  discharge and redness.  Respiratory:  Negative for chest tightness and shortness of breath.   Cardiovascular:  Negative for chest pain and palpitations.  Gastrointestinal:  Negative for abdominal distention, blood in stool and constipation.  Genitourinary:  Negative for difficulty urinating and frequency.  Musculoskeletal:  Negative for arthralgias and joint swelling.  Skin:  Negative for color change, pallor and rash.  Neurological:  Negative for dizziness, facial asymmetry and headaches.  Psychiatric/Behavioral:  Negative for agitation, behavioral problems, confusion and decreased concentration.       Objective:    Physical Exam Constitutional:      Appearance: Normal appearance. He is normal weight.  HENT:     Head: Normocephalic and atraumatic.     Right Ear: Tympanic membrane normal.     Left Ear: Tympanic  membrane normal.     Nose: Nose normal.     Mouth/Throat:     Mouth: Mucous membranes are moist.     Pharynx: Oropharynx is clear.  Eyes:     Extraocular Movements: Extraocular movements intact.     Conjunctiva/sclera: Conjunctivae normal.     Pupils: Pupils are equal, round, and reactive to light.  Cardiovascular:     Rate and Rhythm: Normal rate and regular rhythm.     Pulses: Normal pulses.     Heart sounds: Normal heart sounds.  Pulmonary:     Effort: Pulmonary effort is normal.     Breath sounds: Normal breath sounds.  Abdominal:     General: Abdomen is flat. Bowel sounds are normal.     Palpations: Abdomen is soft.  Musculoskeletal:     Cervical back: Normal range of motion.  Skin:    General: Skin is warm.     Capillary Refill: Capillary refill takes less than 2 seconds.  Neurological:     General: No focal deficit present.     Mental Status: He is alert and oriented to person, place, and time. Mental status is at baseline.  Psychiatric:        Mood and Affect: Mood normal.        Behavior: Behavior normal.        Thought Content: Thought content normal.        Judgment: Judgment normal.     BP 130/88   Pulse 96   Ht 5' 7"  (1.702 m)   Wt 158 lb 1.6 oz (71.7 kg)   BMI 24.76 kg/m  Wt Readings from Last 3 Encounters:  08/14/21 158 lb 1.6 oz (71.7 kg)  07/15/21 157 lb 4.8 oz (71.4 kg)  07/14/21 157 lb 9.6 oz (71.5 kg)     Health Maintenance Due  Topic Date Due   COVID-19 Vaccine (1) Never done   FOOT EXAM  Never done   OPHTHALMOLOGY EXAM  Never done   URINE MICROALBUMIN  Never done   HIV Screening  Never done   Hepatitis C Screening  Never done   TETANUS/TDAP  Never done   COLONOSCOPY (Pts 45-58yr Insurance coverage will need to be confirmed)  Never done   Zoster Vaccines- Shingrix (1 of 2) Never done   INFLUENZA VACCINE  08/05/2021    There are no preventive care reminders to display for this patient.  Lab Results  Component Value Date   TSH 1.05  08/12/2021   Lab Results  Component Value Date   WBC 5.6 08/12/2021   HGB 15.2 08/12/2021   HCT 45.0 08/12/2021   MCV 88.9 08/12/2021   PLT 271 08/12/2021   Lab  Results  Component Value Date   NA 138 08/12/2021   K 5.0 08/12/2021   CO2 22 08/12/2021   GLUCOSE 110 (H) 08/12/2021   BUN 13 08/12/2021   CREATININE 0.84 08/12/2021   BILITOT 0.4 08/12/2021   AST 39 (H) 08/12/2021   ALT 53 (H) 08/12/2021   PROT 6.9 08/12/2021   CALCIUM 9.5 08/12/2021   ANIONGAP 10 04/09/2021   EGFR 97 08/12/2021   Lab Results  Component Value Date   CHOL 124 08/12/2021   Lab Results  Component Value Date   HDL 63 08/12/2021   Lab Results  Component Value Date   LDLCALC 37 08/12/2021   Lab Results  Component Value Date   TRIG 153 (H) 08/12/2021   Lab Results  Component Value Date   CHOLHDL 2.0 08/12/2021   Lab Results  Component Value Date   HGBA1C 6.6 (H) 08/12/2021      Assessment & Plan:   Problem List Items Addressed This Visit       Cardiovascular and Mediastinum   Primary hypertension    Patient blood pressure 130/88 today. Advised patient to eat low-salt and heart healthy diet. Continue diltiazem 240 mg once a day. Will continue to monitor.        Endocrine   Type 2 diabetes mellitus without complication, without long-term current use of insulin ([place]) - Primary    Patient hemoglobin A1c 6.6 on 08/12/2021. Advised patient to watch diet and follow regular exercise routine. Continue metformin 500 mg once a day. Will adjust the medication if A1c not improved.       Relevant Orders   POCT glucose (manual entry) (Completed)     Other   Elevated liver enzymes    Patient has elevated AST and ALT. Advised patient to cut down on alcohol. Will repeat the labs in a month.        No orders of the defined types were placed in this encounter.    Follow-up: No follow-ups on file.    Theresia Lo, NP

## 2021-08-14 NOTE — Assessment & Plan Note (Signed)
Patient blood pressure 130/88 today. Advised patient to eat low-salt and heart healthy diet. Continue diltiazem 240 mg once a day. Will continue to monitor.

## 2021-08-14 NOTE — Assessment & Plan Note (Addendum)
Patient hemoglobin A1c 6.6 on 08/12/2021. Advised patient to watch diet and follow regular exercise routine. Continue metformin 500 mg once a day. Will adjust the medication if A1c not improved.

## 2021-08-20 DIAGNOSIS — M25572 Pain in left ankle and joints of left foot: Secondary | ICD-10-CM | POA: Diagnosis not present

## 2021-08-27 DIAGNOSIS — M25572 Pain in left ankle and joints of left foot: Secondary | ICD-10-CM | POA: Diagnosis not present

## 2021-08-29 DIAGNOSIS — H43813 Vitreous degeneration, bilateral: Secondary | ICD-10-CM | POA: Diagnosis not present

## 2021-08-29 LAB — HM DIABETES EYE EXAM

## 2021-09-03 DIAGNOSIS — M25572 Pain in left ankle and joints of left foot: Secondary | ICD-10-CM | POA: Diagnosis not present

## 2021-09-11 ENCOUNTER — Encounter: Payer: Self-pay | Admitting: *Deleted

## 2021-11-13 ENCOUNTER — Ambulatory Visit (INDEPENDENT_AMBULATORY_CARE_PROVIDER_SITE_OTHER): Payer: BC Managed Care – PPO

## 2021-11-13 DIAGNOSIS — I10 Essential (primary) hypertension: Secondary | ICD-10-CM

## 2021-11-13 DIAGNOSIS — E785 Hyperlipidemia, unspecified: Secondary | ICD-10-CM | POA: Diagnosis not present

## 2021-11-13 DIAGNOSIS — E119 Type 2 diabetes mellitus without complications: Secondary | ICD-10-CM

## 2021-11-13 DIAGNOSIS — R319 Hematuria, unspecified: Secondary | ICD-10-CM

## 2021-11-13 DIAGNOSIS — Z0000 Encounter for general adult medical examination without abnormal findings: Secondary | ICD-10-CM

## 2021-11-13 DIAGNOSIS — R5383 Other fatigue: Secondary | ICD-10-CM | POA: Diagnosis not present

## 2021-11-13 DIAGNOSIS — Z125 Encounter for screening for malignant neoplasm of prostate: Secondary | ICD-10-CM

## 2021-11-13 LAB — POCT URINALYSIS DIPSTICK
Bilirubin, UA: NEGATIVE
Blood, UA: POSITIVE
Glucose, UA: NEGATIVE
Ketones, UA: NEGATIVE
Leukocytes, UA: NEGATIVE
Nitrite, UA: NEGATIVE
Protein, UA: POSITIVE — AB
Spec Grav, UA: 1.015 (ref 1.010–1.025)
Urobilinogen, UA: NEGATIVE E.U./dL — AB
pH, UA: 7 (ref 5.0–8.0)

## 2021-11-13 LAB — TIQ-MISC

## 2021-11-14 ENCOUNTER — Encounter: Payer: Self-pay | Admitting: Nurse Practitioner

## 2021-11-14 ENCOUNTER — Ambulatory Visit: Payer: BC Managed Care – PPO | Admitting: Nurse Practitioner

## 2021-11-14 VITALS — BP 134/83 | HR 75 | Temp 97.8°F | Ht 67.0 in | Wt 161.8 lb

## 2021-11-14 DIAGNOSIS — Z23 Encounter for immunization: Secondary | ICD-10-CM

## 2021-11-14 DIAGNOSIS — I10 Essential (primary) hypertension: Secondary | ICD-10-CM | POA: Diagnosis not present

## 2021-11-14 DIAGNOSIS — E875 Hyperkalemia: Secondary | ICD-10-CM

## 2021-11-14 DIAGNOSIS — H60503 Unspecified acute noninfective otitis externa, bilateral: Secondary | ICD-10-CM

## 2021-11-14 DIAGNOSIS — E119 Type 2 diabetes mellitus without complications: Secondary | ICD-10-CM

## 2021-11-14 DIAGNOSIS — R748 Abnormal levels of other serum enzymes: Secondary | ICD-10-CM

## 2021-11-14 LAB — COMPLETE METABOLIC PANEL WITH GFR
AG Ratio: 1.7 (calc) (ref 1.0–2.5)
ALT: 48 U/L — ABNORMAL HIGH (ref 9–46)
AST: 36 U/L — ABNORMAL HIGH (ref 10–35)
Albumin: 4.6 g/dL (ref 3.6–5.1)
Alkaline phosphatase (APISO): 58 U/L (ref 35–144)
BUN: 11 mg/dL (ref 7–25)
CO2: 27 mmol/L (ref 20–32)
Calcium: 9.7 mg/dL (ref 8.6–10.3)
Chloride: 102 mmol/L (ref 98–110)
Creat: 0.79 mg/dL (ref 0.70–1.35)
Globulin: 2.7 g/dL (calc) (ref 1.9–3.7)
Glucose, Bld: 121 mg/dL — ABNORMAL HIGH (ref 65–99)
Potassium: 5.7 mmol/L — ABNORMAL HIGH (ref 3.5–5.3)
Sodium: 139 mmol/L (ref 135–146)
Total Bilirubin: 0.4 mg/dL (ref 0.2–1.2)
Total Protein: 7.3 g/dL (ref 6.1–8.1)
eGFR: 99 mL/min/{1.73_m2} (ref >=60)

## 2021-11-14 LAB — CBC WITH DIFFERENTIAL/PLATELET
Absolute Monocytes: 392 cells/uL (ref 200–950)
Basophils Absolute: 78 cells/uL (ref 0–200)
Basophils Relative: 1.4 %
Eosinophils Absolute: 319 cells/uL (ref 15–500)
Eosinophils Relative: 5.7 %
HCT: 43.3 % (ref 38.5–50.0)
Hemoglobin: 14.6 g/dL (ref 13.2–17.1)
Lymphs Abs: 1982 cells/uL (ref 850–3900)
MCH: 29.4 pg (ref 27.0–33.0)
MCHC: 33.7 g/dL (ref 32.0–36.0)
MCV: 87.1 fL (ref 80.0–100.0)
MPV: 9.9 fL (ref 7.5–12.5)
Monocytes Relative: 7 %
Neutro Abs: 2828 cells/uL (ref 1500–7800)
Neutrophils Relative %: 50.5 %
Platelets: 290 10*3/uL (ref 140–400)
RBC: 4.97 10*6/uL (ref 4.20–5.80)
RDW: 12.7 % (ref 11.0–15.0)
Total Lymphocyte: 35.4 %
WBC: 5.6 10*3/uL (ref 3.8–10.8)

## 2021-11-14 LAB — LIPID PANEL
Cholesterol: 132 mg/dL (ref <200)
HDL: 76 mg/dL (ref >=40)
LDL Cholesterol (Calc): 39 mg/dL (calc)
Non-HDL Cholesterol (Calc): 56 mg/dL (calc) (ref <130)
Total CHOL/HDL Ratio: 1.7 (calc) (ref <5.0)
Triglycerides: 86 mg/dL (ref <150)

## 2021-11-14 LAB — HEMOGLOBIN A1C
Hgb A1c MFr Bld: 6.7 % of total Hgb — ABNORMAL HIGH (ref <5.7)
Mean Plasma Glucose: 146 mg/dL
eAG (mmol/L): 8.1 mmol/L

## 2021-11-14 LAB — VITAMIN B12: Vitamin B-12: 293 pg/mL (ref 200–1100)

## 2021-11-14 LAB — PSA: PSA: 1.97 ng/mL (ref <=4.00)

## 2021-11-14 MED ORDER — NEOMYCIN-POLYMYXIN-HC 3.5-10000-1 OT SOLN: 3.0000 [drp] | Freq: Four times a day (QID) | OTIC | 0 refills | Status: AC

## 2021-11-14 NOTE — Progress Notes (Signed)
Established Patient Office Visit  Subjective:  Patient ID: Noah Ho, male    DOB: Apr 29, 1955  Age: 66 y.o. MRN: [id number]  CC:  Chief Complaint  Patient presents with   Diabetes     HPI  Noah Ho presents for 3 month follow up. He has started IV hydration 3- 4 months back.   Diabetes Pertinent negatives for diabetes include no chest pain.     Past Medical History:  Diagnosis Date   Borderline diabetes    BPH (benign prostatic hyperplasia)    GERD (gastroesophageal reflux disease)    Hyperlipidemia    Hypertension     Past Surgical History:  Procedure Laterality Date   HIATAL HERNIA REPAIR      No family history on file.  Social History   Socioeconomic History   Marital status: Married    Spouse name: Not on file   Number of children: Not on file   Years of education: Not on file   Highest education level: Not on file  Occupational History   Not on file  Tobacco Use   Smoking status: Never   Smokeless tobacco: Never  Substance and Sexual Activity   Alcohol use: Yes    Alcohol/week: 3.0 standard drinks of alcohol    Types: 3 Standard drinks or equivalent per week   Drug use: Not on file   Sexual activity: Not on file  Other Topics Concern   Not on file  Social History Narrative   Not on file   Social Determinants of Health   Financial Resource Strain: Not on file  Food Insecurity: Not on file  Transportation Needs: Not on file  Physical Activity: Not on file  Stress: Not on file  Social Connections: Not on file  Intimate Partner Violence: Not on file     Outpatient Medications Prior to Visit  Medication Sig Dispense Refill   diltiazem (CARDIZEM CD) 240 MG 24 hr capsule Take 1 capsule (240 mg total) by mouth daily at 2 PM. 90 capsule 3   metFORMIN (GLUCOPHAGE-XR) 500 MG 24 hr tablet Take 1 tablet (500 mg total) by mouth daily. 90 tablet 3   rosuvastatin (CRESTOR) 10 MG tablet Take 1 tablet (10 mg total) by mouth at  bedtime. 90 tablet 3   tamsulosin (FLOMAX) 0.4 MG CAPS capsule Take 1 capsule (0.4 mg total) by mouth daily. 90 capsule 3   diclofenac Sodium (VOLTAREN) 1 % GEL Apply 4 g topically 2 (two) times daily. 100 g 1   meloxicam (MOBIC) 7.5 MG tablet Take 7.5 mg by mouth 2 (two) times daily.     No facility-administered medications prior to visit.    No Known Allergies  ROS Review of Systems  Constitutional: Negative.   HENT:         Ear irritation  Eyes: Negative.   Respiratory:  Negative for chest tightness and shortness of breath.   Cardiovascular:  Negative for chest pain and palpitations.  Gastrointestinal: Negative.   Endocrine: Negative.   Genitourinary: Negative.       Objective:    Physical Exam Constitutional:      Appearance: Normal appearance. He is overweight.  HENT:     Ears:     Comments: External canal: mild erythema bilaterally    Nose: Nose normal.     Mouth/Throat:     Mouth: Mucous membranes are moist.  Eyes:     Conjunctiva/sclera: Conjunctivae normal.     Pupils: Pupils are equal, round, and  reactive to light.  Cardiovascular:     Rate and Rhythm: Normal rate and regular rhythm.     Pulses: Normal pulses.     Heart sounds: Normal heart sounds.  Pulmonary:     Effort: Pulmonary effort is normal.  Abdominal:     General: Abdomen is flat. Bowel sounds are normal. There is no distension.     Palpations: Abdomen is soft.     Hernia: No hernia is present.  Musculoskeletal:        General: Normal range of motion.  Skin:    General: Skin is warm.     Capillary Refill: Capillary refill takes less than 2 seconds.  Neurological:     General: No focal deficit present.     Mental Status: He is alert and oriented to person, place, and time. Mental status is at baseline.  Psychiatric:        Mood and Affect: Mood normal.        Behavior: Behavior normal.        Thought Content: Thought content normal.        Judgment: Judgment normal.     BP 134/83    Pulse 75   Temp 97.8 F (36.6 C) (Temporal)   Ht _0  (1.702 m)   Wt 161 lb 12.8 oz (73.4 kg)   SpO2 98%   BMI 25.34 kg/m  Wt Readings from Last 3 Encounters:  11/14/21 161 lb 12.8 oz (73.4 kg)  08/14/21 158 lb 1.6 oz (71.7 kg)  07/15/21 157 lb 4.8 oz (71.4 kg)     Health Maintenance  Topic Date Due   COVID-19 Vaccine (1) Never done   HIV Screening  Never done   Diabetic kidney evaluation - Urine ACR  Never done   Hepatitis C Screening  Never done   DTaP/Tdap/Td (1 - Tdap) Never done   Zoster Vaccines- Shingrix (1 of 2) 02/14/2022 (Originally 04/10/2007)   HEMOGLOBIN A1C  05/14/2022   OPHTHALMOLOGY EXAM  08/30/2022   Diabetic kidney evaluation - GFR measurement  11/14/2022   FOOT EXAM  11/15/2022   COLONOSCOPY (Pts 45-34yr Insurance coverage will need to be confirmed)  11/14/2028   INFLUENZA VACCINE  Completed   HPV VACCINES  Aged Out    There are no preventive care reminders to display for this patient.  Lab Results  Component Value Date   TSH 1.05 08/12/2021   Lab Results  Component Value Date   WBC 5.6 11/13/2021   HGB 14.6 11/13/2021   HCT 43.3 11/13/2021   MCV 87.1 11/13/2021   PLT 290 11/13/2021   Lab Results  Component Value Date   NA 139 11/13/2021   K 5.7 (H) 11/13/2021   CO2 27 11/13/2021   GLUCOSE 121 (H) 11/13/2021   BUN 11 11/13/2021   CREATININE 0.79 11/13/2021   BILITOT 0.4 11/13/2021   AST 36 (H) 11/13/2021   ALT 48 (H) 11/13/2021   PROT 7.3 11/13/2021   CALCIUM 9.7 11/13/2021   ANIONGAP 10 04/09/2021   EGFR 99 11/13/2021   Lab Results  Component Value Date   CHOL 132 11/13/2021   Lab Results  Component Value Date   HDL 76 11/13/2021   Lab Results  Component Value Date   LDLCALC 39 11/13/2021   Lab Results  Component Value Date   TRIG 86 11/13/2021   Lab Results  Component Value Date   CHOLHDL 1.7 11/13/2021   Lab Results  Component Value Date   HGBA1C 6.7 (H) 11/13/2021  Assessment & Plan:   Problem List  Items Addressed This Visit       Cardiovascular and Mediastinum   Primary hypertension    Patient BP  Vitals:   11/14/21 1015  BP: 134/83    in the office today. Advised pt to follow a low salt, heart healthy diet and regular exercise schedule. Continue diltiazem 240 mg daily.          Endocrine   Type 2 diabetes mellitus without complication, without long-term current use of insulin ([place])    Patient hemoglobin A1c 6.7 on 11/13/2021. Encourage patient to continue balanced diet and follow regular exercise schedule. Continue metformin 500 mg daily        Nervous and Auditory   Acute otitis externa of both ears    Started him on Ciprodex eardrops        Other   Elevated liver enzymes    He has elevated AST and ALT. Encourage patient to cut down on drinking alcohol. We will continue to monitor      Hyperkalemia    Elevated potassium level 5.7. He has recently started on new hydration drink 3 months ago. Advised patient to stop consuming the drink and will repeat potassium level in a month.      Other Visit Diagnoses     Flu vaccine need    -  Primary   Relevant Orders   Flu Vaccine QUAD 6+ mos PF IM (Fluarix Quad PF) (Completed)        Meds ordered this encounter  Medications   neomycin-polymyxin-hydrocortisone (CORTISPORIN) OTIC solution    Sig: Place 3 drops into both ears 4 (four) times daily.    Dispense:  10 mL    Refill:  0     Follow-up: No follow-ups on file.    Theresia Lo, NP

## 2021-12-04 ENCOUNTER — Encounter: Payer: Self-pay | Admitting: Nurse Practitioner

## 2021-12-04 DIAGNOSIS — H60503 Unspecified acute noninfective otitis externa, bilateral: Secondary | ICD-10-CM | POA: Insufficient documentation

## 2021-12-04 DIAGNOSIS — E875 Hyperkalemia: Secondary | ICD-10-CM | POA: Insufficient documentation

## 2021-12-04 NOTE — Assessment & Plan Note (Signed)
Elevated potassium level 5.7. He has recently started on new hydration drink 3 months ago. Advised patient to stop consuming the drink and will repeat potassium level in a month.

## 2021-12-04 NOTE — Assessment & Plan Note (Signed)
Started him on Ciprodex eardrops

## 2021-12-04 NOTE — Assessment & Plan Note (Signed)
He has elevated AST and ALT. Encourage patient to cut down on drinking alcohol. We will continue to monitor

## 2021-12-04 NOTE — Assessment & Plan Note (Signed)
Patient BP  Vitals:   11/14/21 1015  BP: 134/83    in the office today. Advised pt to follow a low salt, heart healthy diet and regular exercise schedule. Continue diltiazem 240 mg daily.

## 2021-12-04 NOTE — Assessment & Plan Note (Signed)
Patient hemoglobin A1c 6.7 on 11/13/2021. Encourage patient to continue balanced diet and follow regular exercise schedule. Continue metformin 500 mg daily

## 2021-12-19 ENCOUNTER — Ambulatory Visit: Payer: BC Managed Care – PPO

## 2021-12-19 DIAGNOSIS — E875 Hyperkalemia: Secondary | ICD-10-CM

## 2021-12-20 LAB — COMPLETE METABOLIC PANEL WITH GFR
AG Ratio: 1.8 (calc) (ref 1.0–2.5)
ALT: 44 U/L (ref 9–46)
AST: 28 U/L (ref 10–35)
Albumin: 4.6 g/dL (ref 3.6–5.1)
Alkaline phosphatase (APISO): 60 U/L (ref 35–144)
BUN: 16 mg/dL (ref 7–25)
CO2: 26 mmol/L (ref 20–32)
Calcium: 9.7 mg/dL (ref 8.6–10.3)
Chloride: 101 mmol/L (ref 98–110)
Creat: 0.87 mg/dL (ref 0.70–1.35)
Globulin: 2.5 g/dL (calc) (ref 1.9–3.7)
Glucose, Bld: 133 mg/dL — ABNORMAL HIGH (ref 65–99)
Potassium: 4.4 mmol/L (ref 3.5–5.3)
Sodium: 137 mmol/L (ref 135–146)
Total Bilirubin: 0.4 mg/dL (ref 0.2–1.2)
Total Protein: 7.1 g/dL (ref 6.1–8.1)
eGFR: 96 mL/min/{1.73_m2} (ref >=60)

## 2022-01-21 ENCOUNTER — Other Ambulatory Visit: Payer: Self-pay | Admitting: Internal Medicine

## 2022-01-22 ENCOUNTER — Telehealth: Payer: Self-pay

## 2022-01-22 NOTE — Telephone Encounter (Signed)
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 Patient completed Individual Contact Form over the phone. RN explained the incubation timeframe. Patient reports he and his wife are  leaving on 02/08/2022 for a 2 month trip, so QFT is scheduled for 02/06/22 at 11:30. Patient will not have cell service while away, but will have access to email and would like his results emailed to [email or web address].  Servando Salina, RN

## 2022-01-23 ENCOUNTER — Telehealth: Payer: Self-pay | Admitting: Nurse Practitioner

## 2022-01-23 ENCOUNTER — Other Ambulatory Visit: Payer: Self-pay

## 2022-01-23 ENCOUNTER — Encounter: Payer: Self-pay | Admitting: Nurse Practitioner

## 2022-01-23 MED ORDER — ROSUVASTATIN CALCIUM 10 MG PO TABS: 10.0000 mg | ORAL_TABLET | Freq: Every day | ORAL | 3 refills | Status: AC

## 2022-01-23 NOTE — Telephone Encounter (Signed)
[facility], [address] called, They are requesting provider calls in the rosuvastatin (CRESTOR) 10 MG tablet. [facility] to [address] for [employer].

## 2022-01-23 NOTE — Telephone Encounter (Addendum)
Pt [facility]ill on [facility]. Pt would like to be called when it is ready

## 2022-01-23 NOTE — Telephone Encounter (Signed)
Rx sent 

## 2022-02-06 ENCOUNTER — Other Ambulatory Visit: Payer: Self-pay

## 2022-02-06 ENCOUNTER — Other Ambulatory Visit (LOCAL_COMMUNITY_HEALTH_CENTER): Payer: Self-pay

## 2022-02-06 DIAGNOSIS — Z201 Contact with and (suspected) exposure to tuberculosis: Secondary | ICD-10-CM

## 2022-02-06 NOTE — Progress Notes (Signed)
Patient here in clinic today as he was listed as contact to TB case. Wife also here for QFT. Patient is traveling to [place] this Sunday 02/08/22 and will not be back until April. 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 Even if results are negative. Patient asked to have results emailed to him.  Servando Salina, RN

## 2022-02-12 ENCOUNTER — Telehealth: Payer: Self-pay

## 2022-02-12 LAB — QUANTIFERON-TB GOLD PLUS
QuantiFERON Mitogen Value: >10 IU/mL
QuantiFERON Nil Value: 0.18 IU/mL
QuantiFERON TB1 Ag Value: 0.12 IU/mL
QuantiFERON TB2 Ag Value: 0.12 IU/mL
QuantiFERON-TB Gold Plus: NEGATIVE

## 2022-02-12 NOTE — Telephone Encounter (Signed)
Email sent to patient's email. Informed patient and spouse both QFTs were negative. And to contact RN upon return from trip.  Servando Salina, RN

## 2022-04-09 ENCOUNTER — Encounter: Payer: BC Managed Care – PPO | Admitting: Nurse Practitioner

## 2022-04-23 ENCOUNTER — Telehealth: Payer: Self-pay

## 2022-04-23 NOTE — Telephone Encounter (Signed)
Call to patient to schedule a repeat QFT. Patient reports he and wife are moving and he does not want to have a repeat QFT.   Augustin Schooling, RN

## 2023-06-04 IMAGING — CR DG CHEST 2V
2 series · 2 of 2 positions shown · non-contrast
Comparison: None.

CLINICAL DATA: Cough, shortness of breath

EXAM:
CHEST - 2 VIEW

[chest pa]
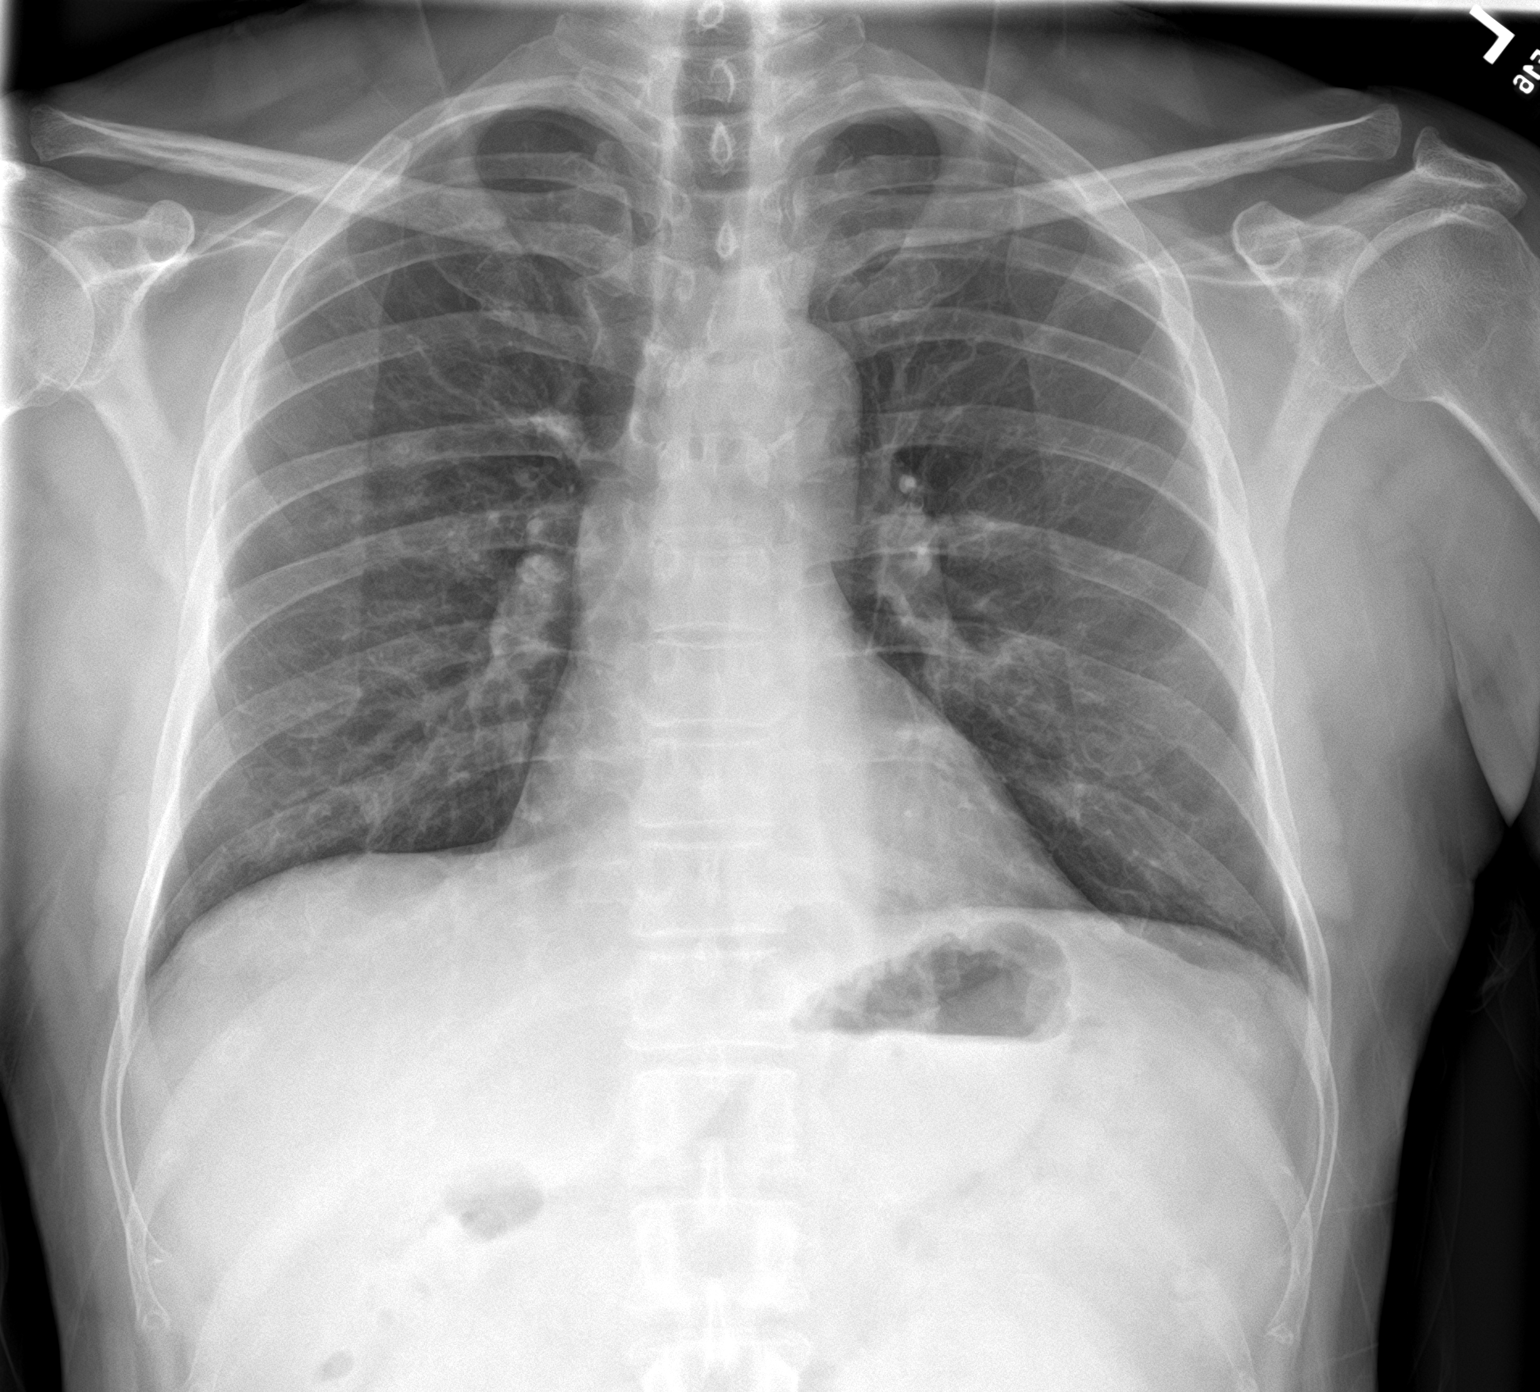

[chest lat]
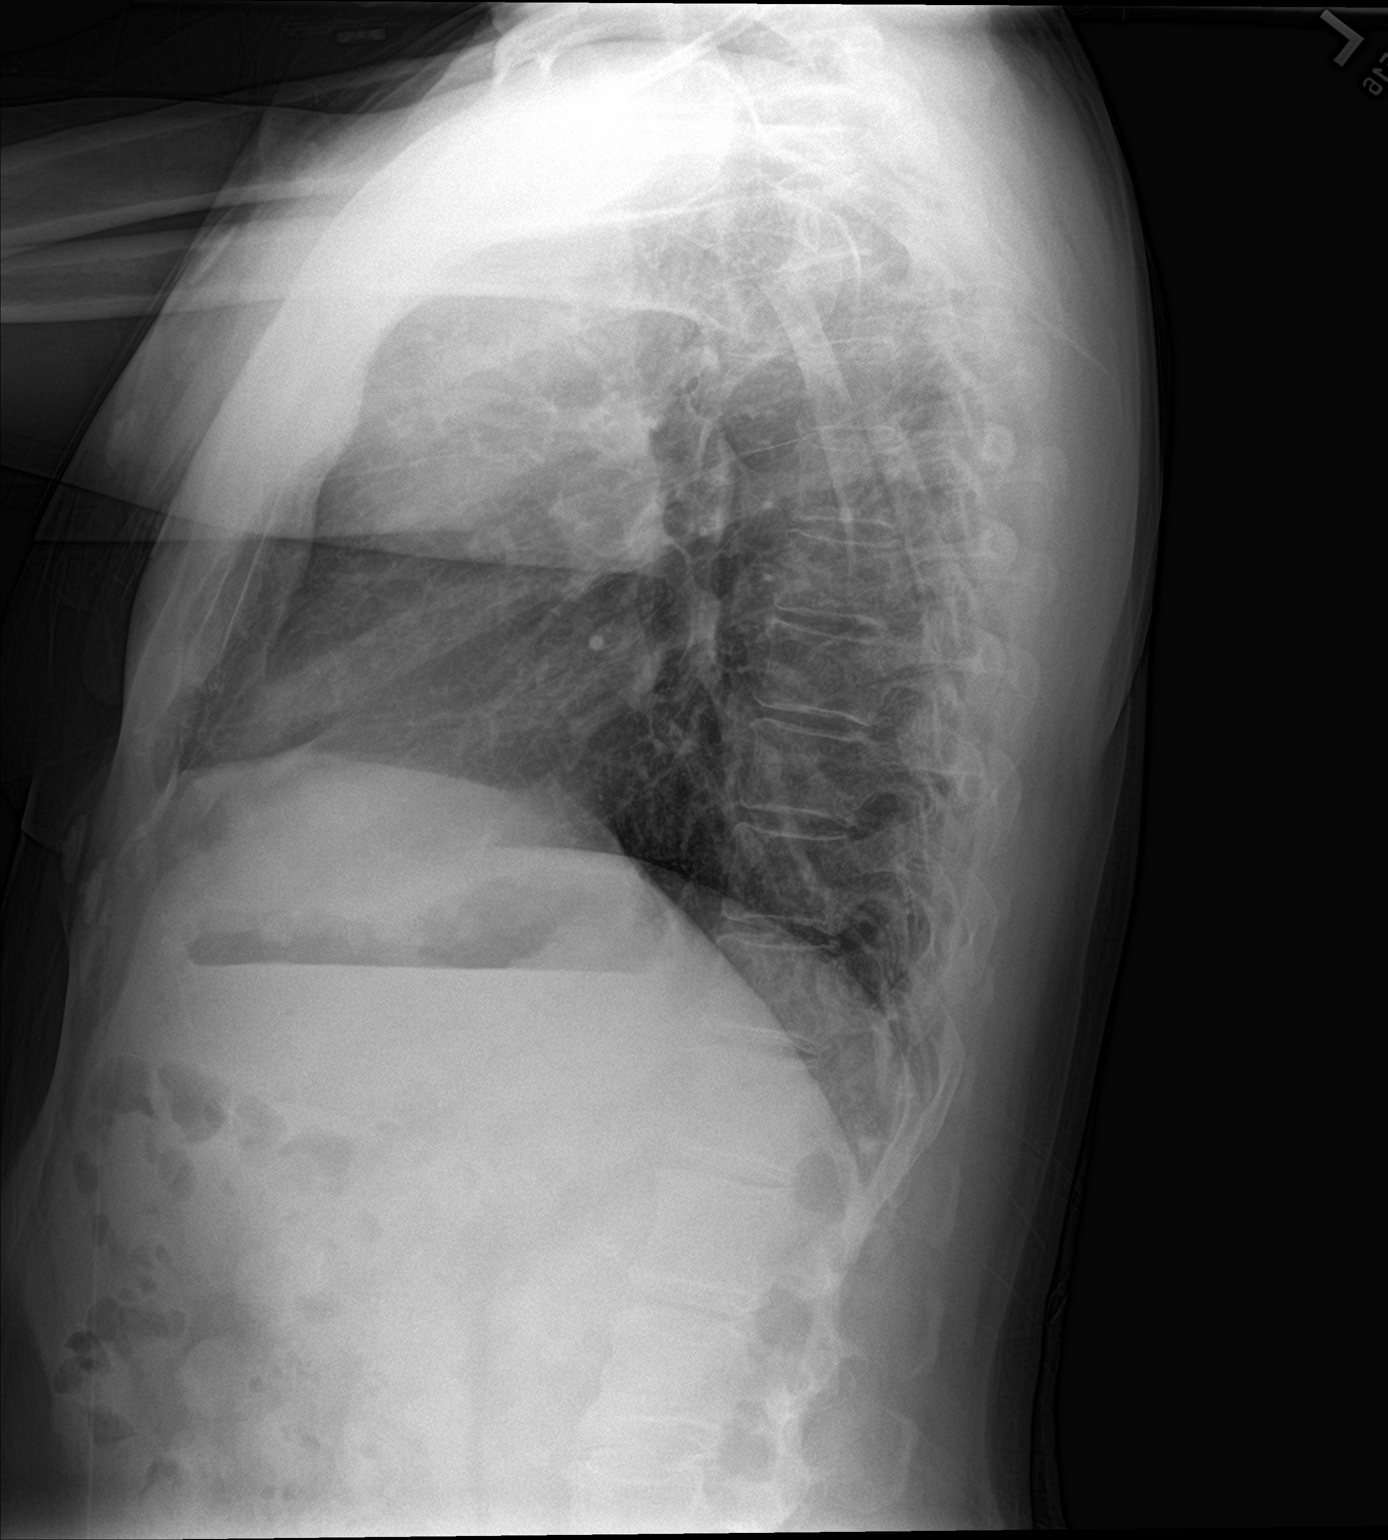

[2 of 2 positions shown; findings below may reference images not displayed]

FINDINGS: The cardiomediastinal silhouette is normal.

There are patchy opacities in the left midlung. There is no other
focal opacity. There is no pulmonary edema. There is no pleural
effusion or pneumothorax.

There is no acute osseous abnormality.
IMPRESSION: Patchy opacities in the left mid lung could reflect infection in the
correct clinical setting. Recommend follow-up radiographs in 3-4
weeks to assess for resolution.
